# Patient Record
Sex: Female | Born: 1970 | Race: White | Hispanic: No | Marital: Married | Smoking: Never smoker
Health system: Southern US, Community
[De-identification: ages and names within clinical notes are randomized; demographics above are authoritative.]

## PROBLEM LIST (undated history)

## (undated) DIAGNOSIS — N83209 Unspecified ovarian cyst, unspecified side: Secondary | ICD-10-CM

## (undated) DIAGNOSIS — R002 Palpitations: Secondary | ICD-10-CM

## (undated) DIAGNOSIS — B379 Candidiasis, unspecified: Secondary | ICD-10-CM

## (undated) DIAGNOSIS — N979 Female infertility, unspecified: Secondary | ICD-10-CM

## (undated) DIAGNOSIS — Z8619 Personal history of other infectious and parasitic diseases: Secondary | ICD-10-CM

## (undated) DIAGNOSIS — G43909 Migraine, unspecified, not intractable, without status migrainosus: Secondary | ICD-10-CM

## (undated) DIAGNOSIS — H8110 Benign paroxysmal vertigo, unspecified ear: Secondary | ICD-10-CM

## (undated) DIAGNOSIS — E2839 Other primary ovarian failure: Secondary | ICD-10-CM

## (undated) DIAGNOSIS — E288 Other ovarian dysfunction: Secondary | ICD-10-CM

## (undated) DIAGNOSIS — R079 Chest pain, unspecified: Secondary | ICD-10-CM

## (undated) DIAGNOSIS — C439 Malignant melanoma of skin, unspecified: Secondary | ICD-10-CM

## (undated) DIAGNOSIS — N6019 Diffuse cystic mastopathy of unspecified breast: Secondary | ICD-10-CM

## (undated) DIAGNOSIS — N6009 Solitary cyst of unspecified breast: Secondary | ICD-10-CM

## (undated) HISTORY — DX: Unspecified ovarian cyst, unspecified side: N83.209

## (undated) HISTORY — DX: Chest pain, unspecified: R07.9

## (undated) HISTORY — DX: Solitary cyst of unspecified breast: N60.09

## (undated) HISTORY — PX: HERNIA REPAIR: SHX51

## (undated) HISTORY — DX: Palpitations: R00.2

## (undated) HISTORY — PX: MELANOMA EXCISION: SHX5266

## (undated) HISTORY — DX: Diffuse cystic mastopathy of unspecified breast: N60.19

## (undated) HISTORY — PX: UMBILICAL HERNIA REPAIR: SHX196

## (undated) HISTORY — DX: Personal history of other infectious and parasitic diseases: Z86.19

## (undated) HISTORY — DX: Female infertility, unspecified: N97.9

## (undated) HISTORY — DX: Candidiasis, unspecified: B37.9

## (undated) HISTORY — DX: Benign paroxysmal vertigo, unspecified ear: H81.10

---

## 1992-07-02 HISTORY — PX: RHINOPLASTY: SUR1284

## 1998-09-16 ENCOUNTER — Other Ambulatory Visit: Admission: RE | Admit: 1998-09-16 | Discharge: 1998-09-16 | Payer: Self-pay | Admitting: Obstetrics and Gynecology

## 1999-09-18 ENCOUNTER — Other Ambulatory Visit: Admission: RE | Admit: 1999-09-18 | Discharge: 1999-09-18 | Payer: Self-pay | Admitting: Obstetrics and Gynecology

## 2000-09-12 ENCOUNTER — Inpatient Hospital Stay (HOSPITAL_COMMUNITY): Admission: AD | Admit: 2000-09-12 | Discharge: 2000-09-12 | Payer: Self-pay | Admitting: Obstetrics and Gynecology

## 2000-09-19 ENCOUNTER — Other Ambulatory Visit: Admission: RE | Admit: 2000-09-19 | Discharge: 2000-09-19 | Payer: Self-pay | Admitting: Obstetrics and Gynecology

## 2001-04-11 ENCOUNTER — Inpatient Hospital Stay (HOSPITAL_COMMUNITY): Admission: AD | Admit: 2001-04-11 | Discharge: 2001-04-14 | Payer: Self-pay | Admitting: Obstetrics and Gynecology

## 2001-04-11 ENCOUNTER — Encounter (INDEPENDENT_AMBULATORY_CARE_PROVIDER_SITE_OTHER): Payer: Self-pay | Admitting: *Deleted

## 2001-05-12 ENCOUNTER — Other Ambulatory Visit: Admission: RE | Admit: 2001-05-12 | Discharge: 2001-05-12 | Payer: Self-pay | Admitting: Obstetrics and Gynecology

## 2002-08-05 ENCOUNTER — Other Ambulatory Visit: Admission: RE | Admit: 2002-08-05 | Discharge: 2002-08-05 | Payer: Self-pay | Admitting: Obstetrics and Gynecology

## 2003-08-18 ENCOUNTER — Other Ambulatory Visit: Admission: RE | Admit: 2003-08-18 | Discharge: 2003-08-18 | Payer: Self-pay | Admitting: Obstetrics and Gynecology

## 2005-08-13 ENCOUNTER — Encounter: Admission: RE | Admit: 2005-08-13 | Discharge: 2005-08-13 | Payer: Self-pay | Admitting: Obstetrics and Gynecology

## 2006-02-11 ENCOUNTER — Encounter: Admission: RE | Admit: 2006-02-11 | Discharge: 2006-02-11 | Payer: Self-pay | Admitting: Obstetrics and Gynecology

## 2006-09-13 ENCOUNTER — Encounter: Admission: RE | Admit: 2006-09-13 | Discharge: 2006-09-13 | Payer: Self-pay | Admitting: Obstetrics and Gynecology

## 2008-07-02 HISTORY — PX: DILATION AND CURETTAGE OF UTERUS: SHX78

## 2008-09-30 ENCOUNTER — Ambulatory Visit (HOSPITAL_COMMUNITY): Admission: RE | Admit: 2008-09-30 | Discharge: 2008-09-30 | Payer: Self-pay | Admitting: Obstetrics and Gynecology

## 2008-09-30 ENCOUNTER — Encounter (INDEPENDENT_AMBULATORY_CARE_PROVIDER_SITE_OTHER): Payer: Self-pay | Admitting: Obstetrics and Gynecology

## 2009-12-29 ENCOUNTER — Encounter: Admission: RE | Admit: 2009-12-29 | Discharge: 2009-12-29 | Payer: Self-pay | Admitting: Obstetrics and Gynecology

## 2010-07-23 ENCOUNTER — Encounter: Payer: Self-pay | Admitting: Otolaryngology

## 2010-10-11 LAB — URINALYSIS, ROUTINE W REFLEX MICROSCOPIC
Hgb urine dipstick: NEGATIVE
Ketones, ur: NEGATIVE mg/dL
Nitrite: NEGATIVE
Protein, ur: NEGATIVE mg/dL
Urobilinogen, UA: 0.2 mg/dL (ref 0.0–1.0)

## 2010-10-11 LAB — CBC
HCT: 41.6 % (ref 36.0–46.0)
Platelets: 255 10*3/uL (ref 150–400)
WBC: 8.3 10*3/uL (ref 4.0–10.5)

## 2010-11-14 NOTE — H&P (Signed)
NAME:  Madeline Walker, Madeline Walker NO.:  [id number]   MEDICAL RECORD NO.:  [id number]            PATIENT TYPE:   LOCATION:                                 FACILITY:   PHYSICIAN:  Osborn Coho, M.D.   DATE OF BIRTH:  [birth year]   DATE OF ADMISSION:  DATE OF DISCHARGE:                              HISTORY & PHYSICAL    Date of admission is pending at present.  She is going to be scheduled  for a D and C.  Ms. __________ is a 40 year old married white female,  gravida 2, para 1-0-0-1, who entered care at [facility] for  prenatal care on September 20, 2008, for a new OB interview.  She returned  on September 28, 2008, for her new OB workup and on that day was diagnosed  with a missed abortion.  She during a 20-minute conversation plus with  the patient after reviewing her history was very anxious and tearful  regarding her pregnancy and her history of infertility and had much  anxiety after being previously told that she would not be able to  conceive.  She had, had lab work that showed low ovarian reserve and was  told that there would be a low chance to harvest any eggs for an in  vitro fertilization attempt.  However, she did conceive spontaneously  and she had two different consultations with Dr. __________ at [facility] as well as most recently been  seen by Dr. Elesa Hacker, an infertility specialist in [facility].  Following  her conception, she had seen Dr. Elesa Hacker last on September 07, 2008, and had  a transvaginal ultrasound showing a viable single intrauterine pregnancy  with a heart rate and had a crown-rump length that gave an [facility] of  May 01, 2009.  Her LMP was July 19, 2008, which originally gave  her an [facility] of April 25, 2009.  However, since having stopped birth  control pills, she has had irregular periods that have varied in length  and EDC was set by the ultrasound for May 01, 2009.  After having  the discussion in  the office during her new OB conversation, fetal heart  tones could not be found with Doppler and was sent for a transvaginal  ultrasound for viability.  Ultrasound revealed no fetal heart tones,  embryo was still present and it was measuring with a crown-rump length  of 6 weeks and 2 days, which she should have been 9 weeks and 3 days.  Gestational sac was present.  There was no yolk sac.  Findings were  reviewed with the patient.  She was by herself at her interview, but she  did call her husband and after that conversation did desire to go home.  Her history has been remarkable for;  1. Previous C-section.  2. Infertility.  3. History of polyhydramnios with her prior pregnancy.  4. Her son has autism.  5. History of melanoma excision.   During her new OB conversation, she denied any vaginal bleeding or lower  abdominal pain.  She has had some issues  with constipation and has  declined taking any medicine that she can do without.  The medications  that she has been on have been progesterone suppositories every day, as  well as p.o. Prometrium at night.  She has been taking a prenatal  vitamin.   OBSTETRICAL HISTORY:  Rhett Bannister 1 was a C-section, I guess for failure to  descend in October 2002, a female weighing 8 pounds 7 ounces at [redacted] weeks  gestation.  His name is Gerre Pebbles.  He was OP presentation.  He was  delivered by Dr. Cherly Hensen at I guess [facility].  The patient did  voice that she was unhappy with her delivery before, did want to have  [facility] care this time.  She felt like that after two epidurals that she was  not able to push as well as she thought she would be without having to  have a C-section.  Gravida 2 was her current pregnancy.   ALLERGIES:  She denied medication or latex allergies or other  sensitivities.   MENSTRUAL HISTORY:  She reported menarche at age 66.  She had used birth  control pills for some time, but since stopping birth control pills to  conceive has  had very irregular cycles.  She did have a certain LMP of  July 19, 2008.  She reported that she felt like she ovulated the  first week in February, however.   PAST MEDICAL HISTORY:  History of hyperemesis and had IV fluids.  I  believe that was with the first pregnancy.  She did have polyhydramnios  with the first pregnancy.  Contraception; she has used birth control  pills and condoms in the past.  She did have a history of an abnormal  Pap that she reported was years ago, but her last Pap was December 22, 2007, and was normal and that was with our physician assistant, Marquis Lunch. Powell.  Dr. Elesa Hacker did let the patient try some Femara induction,  but did not have any results, but did like I said conceive  spontaneously.  She gets infrequent yeast infections, normal childhood  illnesses.  She reports varicella as a child.  Very infrequent UTIs.  She does report having melanoma in the past.  No radiation or chemo,  just excision of moles.   FAMILY HISTORY:  Remarkable for maternal grandfather with heart disease.  Father hypertension and on medication.  Sister varicose veins, as well  as her mother.  Anemia in a sister.  Mother COPD.  Dad TIAs.  Maternal  grandmother rheumatoid arthritis.  Dad prostate and kidney cancer.  Maternal uncle lung cancer, deceased.  Paternal grandfather colon  cancer.  Mother committed suicide at age 17, had severe depression.  Mother was alcoholic and nicotine addiction.   GENETIC HISTORY:  Remarkable for patient 28 years of age and has a son  with autism.   SOCIAL HISTORY:  She is a married white female.  Her husband's name is  Darren __________.  He works full-time as an Pensions consultant, he has his Ph.D.  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  Her primary care doctor is Dr. Sigmund Hazel with [facility].  She denied  alcohol, tobacco or illicit drug use.   LABS OF SIGNIFICANCE:  Her blood type is A+.  CBC was drawn on September 28, 2008,  white count was 10.1, hematocrit was 38.3, hemoglobin 13.6 and  platelets were 247.  No other blood work was drawn.  Just to note, lab  work that she had, had drawn in the past she had at Dr. Meridee Score, had a  3 FSH levels and a low AMH level which I believe was 0.10.   OBJECTIVE:  VITAL SIGNS:  Blood pressure on September 28, 2008, was 110/60,  otherwise she was afebrile and other vital signs were stable.  Her  weight was 131.  Her height is 5 feet 9 inches.  Her BMI is 18.8, which  was normal weight.  GENERAL:  She was alert and oriented x3, but very anxious regarding  history and then of course she was appropriately upset after results of  the missed AB on the ultrasound.  HEENT:  Within normal limits and grossly intact.  SKIN:  Warm, dry and intact.  CARDIOVASCULAR:  Regular rate and rhythm without murmur.  LUNGS:  Clear to auscultation bilaterally.  BREASTS:  Deferred.  ABDOMEN:  Soft and nontender.  She does have scar from her C-section.  PELVIC:  Deferred.  EXTREMITIES:  Within normal limits.   IMPRESSION:  1. Missed abortion.  2. On ultrasound, the baby was measuring 6 weeks and 2 days with no      cardiac activity and per previous dating was to be around 9 weeks      and 3 days.  3. History of infertility.  4. Previous cesarean section.  5. History of melanoma.  6. Son with autism.  7. Remote history of abnormal Pap smear.   PLAN:  After reviewing results of the ultrasound with the patient  regarding the missed AB, the patient quickly voiced the desire that she  was ready to go.  I did briefly discussed with her three options;  1. Expectant management.  2. Induction of miscarriage was Cytotec.  3. D and C.   The risks, benefits and alternatives were just briefly reviewed with the  patient and did desire to proceed with D and C.  The patient was unsure  upon scheduling, but would like to do as soon as possible.  She then  left after having a CBC drawn and was going to  further talk to her  husband about schedule.  I did briefly do go over the surgery procedure  and plan was made to have __________OR schedule call the patient and set  up the appointment and then have one whichever doctor it is scheduled  with to follow up with her to further answer any questions.      [place]ice [place], [place]      Osborn Coho, M.D.  Electronically Signed    CHS/MEDQ  D:  09/29/2008  T:  09/29/2008  Job:  [id number]

## 2010-11-14 NOTE — Op Note (Signed)
NAMEDANYAL, ADORNO NO.:  [id number]   MEDICAL RECORD NO.:  [id number]          PATIENT TYPE:  AMB   LOCATION:  SDC                           FACILITY:  [facility]   PHYSICIAN:  Janine Limbo, M.D.DATE OF BIRTH:  1971/06/30   DATE OF PROCEDURE:  09/30/2008  DATE OF DISCHARGE:                               OPERATIVE REPORT   PREOPERATIVE DIAGNOSIS:  Missed abortion in the first trimester.   POSTOPERATIVE DIAGNOSIS:  Missed abortion in the first trimester.   PROCEDURE:  Dilatation and evacuation.   SURGEON:  Leonard Schwartz, MD   FIRST ASSISTANT:  None.   ANESTHETIC:  General.   DISPOSITION:  Ms. Poon is a 40 year old female who presents with a  first trimester missed abortion that was confirmed by ultrasound.  She  understands the indications for her surgical procedure and she accepts  the risks of, but not limited to, anesthetic complications, bleeding,  infections, and possible damage to surrounding organs.   FINDINGS:  The uterus sounded to 13 cm.  A moderate-to-large amount of  products of conception removed within the uterine cavity.  No adnexal  masses were appreciated on exam under anesthesia.  The patient's blood  type is noted to be A.   PROCEDURE:  The patient was taken to the operating room where a general  anesthetic was given.  The patient's lower abdomen, perineum, and vagina  were prepped with multiple layers of Betadine.  The bladder was drained  of urine.  Examination under anesthesia was performed.  The patient was  sterilely draped.  A paracervical block was placed using 10 mL of 0.5%  Marcaine with epinephrine.  The uterus sounded to 13 cm.  The cervix was  gently dilated.  The uterine cavity was evacuated using a size 10  suction curette followed by a medium sharp curette.  The cavity was felt  to be clean at the end of our procedure.  Hemostasis was adequate.  All  incisions were removed.  Exam under anesthesia was  repeated and the  uterus was noted to be firm.  Sponge, needle, and instrument counts were  correct on 2 occasions.  The estimated blood loss was 20 mL.  The  patient tolerated her procedure well.  She was awakened from her  anesthetic without difficulty and transported to the recovery room in  stable condition.  The products of conception were sent to Pathology for  evaluation.   FOLLOW-UP INSTRUCTIONS:  The patient will take Zofran 8 mg every 8 hours  as needed for nausea.  She will take Motrin 800 mg every 8 hours as  needed for mild-to-moderate pain.  She will take Vicodin 1 or 2 tablets  every 4  hours as needed for severe pain.  She will return to see Dr. Stefano Gaul in  2-3 weeks for followup examination.  She was given a copy of the  postoperative instruction sheet that was prepared by the [facility] for patients who have undergone a dilatation and  curettage.  She was told to call for questions or concerns.  Janine Limbo, M.D.  Electronically Signed     AVS/MEDQ  D:  09/30/2008  T:  09/30/2008  Job:  [id number]   cc:   Dr. Christin Bach

## 2010-11-17 NOTE — Discharge Summary (Signed)
[facility]  Patient:    Madeline Walker, Madeline Walker Visit Number: [id number] MRN: [id number]          Service Type: OBS Location: 910A [id number] [id number] Attending Physician:  Maxie Better Dictated by:   Sheria Lang. Cherly Hensen, M.D. Admit Date:  04/11/2001 Discharge Date: 04/14/2001                             Discharge Summary  ADMISSION DIAGNOSES:          1. Term gestation.                               2. Active labor.                               3. Spontaneous rupture of membranes.  DISCHARGE DIAGNOSES:          1. Term gestation, delivered.                               2. Status post primary cesarean section.                               3. Arrest of descent.  PROCEDURE:                    1. Primary cesarean section.                               2. Removal of skin lesion.  HISTORY OF PRESENT ILLNESS:   This is a 40 year old, gravida 1, para 0, female at term admitted in active labor with spontaneous rupture of membranes at 3:11 a.m. Her prenatal course had been uncomplicated. Her group B strep culture was negative.  HOSPITAL COURSE:              The patient was admitted to [facility]. At the time that she presented, her cervix was 5 to 6 cm dilated, 90% effaced, and +1 station. Her contractions were about every five to six minutes and she had a reactive nonstress test. She had had spontaneous rupture of membranes at 3:11 a.m. The patient requested and obtained an epidural. She subsequently had Pitocin augmentation of her labor. Intrauterine pressure catheter was also placed and became fully dilated around 1 p.m. Her contractions were about every two to four minutes with no urge to push. During the course of her labor, however, she had a very dense epidural block. With the absence of the urge to push, the epidural was discontinued; however, with the discontinuation of her epidural, she had a window of pain in the right hip area and  her intrauterine pressure catheter revealed suboptimal [place] units, even with the Pitocin being at the maximum 20 milliunits. The arrest of descent was thought to be secondary to the suboptimal uterine pressure in the setting of a previously nonfunctioning epidural. The vertex was allowed to labor down and await urge to push. But even with the urge subsequently to push being felt by the patient, her pain was not tolerable. An examination reveals she was fully, +1 station, left occiput posterior presentation with a caput. The patient was placed in  an exaggerated Sims position and a fetal scalp electrode was placed to further evaluate the baby in that position. The patient was unable to tolerate the degree of pain associated with a nonfunctioning epidural, and even though it was dosed and the anesthesiologist attempted at different modalities to facilitate pain management, epidural was subsequently replaced and we still had issues with her pain control. With the head of the baby still not low enough for an operative assisted vaginal delivery, the patient requested to proceed with a primary cesarean section. Risk and benefit of the procedure was explained to the patient and her husband. She was transferred to the operating room where primary cesarean section was performed. She has a skin lesion just to the left and superior aspect of her Pfannenstiel skin incision, and the patient also requested that be removed as well. With the cesarean section, there was a resultant delivery of a live female from the left occiput posterior presentation weighing 8 pounds 7 ounces and Apgars of 9 and 9. Normal tubes and ovaries were noted. The skin lesion was removed. 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 The patient, whose blood type was A positive and rubella immune and hepatitis B surface antigen negative, had an uncomplicated postoperative course.  She was tolerating a regular diet, passing flatus by postoperative day #3. Her CBC on postoperative day #1 showed a hemoglobin of 10.4, hematocrit of 29.9, white count of 19.5, platelets of 177,000. The incision showed good approximation with no erythema, induration, or exudate. She was deemed well to be discharged on postoperative day #3.  DISPOSITION:                  Home.  CONDITION:                    Stable.  DISCHARGE MEDICATIONS:        1. Percocet one to two tablets every three to                                  four hours p.r.n. pain.                               2. Prenatal vitamins one p.o. q.d.                               3. Motrin 800 mg one p.o. q.6h. p.r.n. pain.  DISCHARGE FOLLOWUP:           The patient is to have a followup appointment in four weeks at [facility].  DISCHARGE INSTRUCTIONS:       The patient is to call for temperature greater than or equal of 100.4, nothing per vagina for four to six weeks. No heavy lifting or driving for two weeks, call if soaking a regular pad every hour or more frequently, incisional redness or drainage, or increased incisional pain, severe abdominal pain, nausea, vomiting, passage of large and frequent clots. ictated by:   Sheronette A. Cherly Hensen, M.D. Attending Physician:  Maxie Better DD:  04/26/01 TD:  04/28/01 Job: [id number]

## 2010-11-17 NOTE — H&P (Signed)
[facility]  Patient:    Madeline Walker, Madeline Walker Visit Number: [id number] MRN: [id number]          Service Type: Attending:  Sheronette A. Cherly Hensen, M.D. Dictated by:   Sheria Lang. Cherly Hensen, M.D. Adm. Date:  04/11/01                           History and Physical  CHIEF COMPLAINT:              Induction of labor.  HISTORY OF PRESENT ILLNESS:   This is a 40 year old gravida 1 para 0 female, EDC of April 14, 2001 by ultrasound on October 22, 2000 at which time the patient was 15.1 weeks who is now at term admitted for induction of labor secondary to favorable cervix and the patients request.  The patient has been having increased pelvic pressure and irregular contractions.  Examination in the office on April 10, 2001 revealed a cervix of 2 cm, 70%, -1, vertex, posterior.  Group B strep culture is negative on March 12, 2001.  Prenatal care has been notable for borderline polyhydramnios on ultrasound done on March 07, 2001.  The estimated fetal weight at that time was 5 pounds 11 ounces consistent with the 60th percentile.  Prenatal care is at [facility], primary obstetrician Sheronette A. Cherly Hensen, M.D.  PRENATAL LABORATORY DATA:     Blood type A positive, antibody screen negative. RPR nonreactive.  Rubella immune.  Hepatitis B surface antigen negative.  HIV test nonreactive.  Parvovirus titer is immune.  GC and chlamydia cultures are negative.  Pap was within normal limits.  AFP3 test was normal.  Normal anatomic fetal survey on December 03, 2000 at which time the patient was 21.[redacted] weeks gestation.  One-hour GCT was normal.  Group B strep culture was negative. Ultrasound on March 07, 2001 for size greater than dates showed an AGA fetus and borderline polyhydramnios.  PAST MEDICAL HISTORY:  ALLERGIES:                    AMOXICILLIN and PENICILLIN - both cause nausea and vomiting.  MEDICATIONS:                  Prenatal vitamins and  iron.  MEDICAL HISTORY:              Negative.  SURGERY:                      Nose surgery.  FAMILY HISTORY:               Father - prostate cancer; paternal grandfather - colon cancer.  Maternal grandfather died of heart disease.  Diabetes in maternal grandfather as well.  Mother is emphysema/smoker.  SOCIAL HISTORY:               Married, nonsmoker.  Husband is an Pensions consultant. She is a Midwife.  REVIEW OF SYSTEMS:            Negative.  PHYSICAL EXAMINATION:  GENERAL:                      Gravid well-developed, well-nourished white female, no acute distress.  VITAL SIGNS:                  Blood pressure 120/76, fetal heart rate 152. Weight 166.8.  SKIN:  Shows no lesions.  HEENT:                        Anicteric sclerae, pink conjunctivae. Oropharynx negative.  HEART:                        Regular rate and rhythm without murmur.  LUNGS:                        Clear to auscultation.  BREASTS:                      Soft, nontender.  No palpable mass.  ABDOMEN:                      Gravid.  Fundal height of 40 cm.  PELVIC:                       Revealed 2, 70%, -1, vertex.  IMPRESSION:                   Term gestation with favorable cervix.  PLAN:                         Admission, routine admission labs, amniotomy. Pitocin augmentation if necessary.  Epidural anesthesia if patient desires; analgesics otherwise. Dictated by:   Sheria Lang. Cherly Hensen, M.D. Attending:  Sheronette A. Cherly Hensen, M.D. DD:  04/11/01 TD:  04/11/01 Job: [id number]

## 2010-11-17 NOTE — Op Note (Signed)
[facility]  Patient:    Madeline Walker, Madeline Walker Visit Number: [id number] MRN: [id number]          Service Type: OBS Location: 910A [id number] Attending Physician:  Maxie Better Dictated by:   Sheria Lang. Cherly Hensen, M.D. Proc. Date: 04/11/01 Admit Date:  04/11/2001                             Operative Report  PREOPERATIVE DIAGNOSES:       1. Arrest of descent.                               2. Skin lesion.  PROCEDURE:                    1. Primary cesarean section, Kerr hysterotomy.                               2. Removal of skin lesion.  POSTOPERATIVE DIAGNOSES:      1. Arrest of descent.                               2. Skin lesion.  ANESTHESIA:                   Epidural.  SURGEON:                      Sheronette A. Cherly Hensen, M.D.  ASSISTANT:                    Lenoard Aden, M.D.  ESTIMATED BLOOD LOSS:         500 cc.  INTRAOPERATIVE FLUID:         2500 cc crystalloid.  DESCRIPTION OF PROCEDURE:     Under adequate general anesthesia, the patient was placed in supine position with a left lateral tilt. An indwelling Foley catheter was already in place prior to transfer to the operating room. The patient was sterilely prepped and draped in the usual fashion. A 1-cm raised lesion was noted in the left lower quadrant of her abdomen which the patient desired to have removed. A marking pen was used to delineate a Pfannenstiel skin incision and 8 cc of 0.25% Marcaine was injected along the marked line. A Pfannenstiel skin incision was then made, carried down to the rectus fascia. The rectus fascia was incised in the midline and extended bilaterally using Bovie cautery. The rectus fascia was then bluntly and sharply dissected off the rectus muscles in a superior and inferior fashion. Rectus muscles were separated in the midline and the parietal peritoneum was entered bluntly. The vesicouterine peritoneum was then opened transversely and the  bladder dissected off the lower uterine segment and displaced from the operative field using a Doyen retractor. A curvilinear low transverse uterine incision was then made and extended bluntly. Subsequent delivery of a live female infant from the left occiput posterior presentation was accomplished. The baby was bulb suctioned on the abdomen. The cord was clamped, cut, and the baby was transferred to the awaiting pediatricians who assigned Apgars of 9 and 9 at one and five minutes. The placenta, which was posterior, was manually removed. The uterine cavity was cleaned of debris. The uterus was not exteriorized. The uterine  incision was then demarcated using ring clamps and the incision was closed with O Monocryl in a running locked stitch. A second layer was imbricated using O Monocryl suture. A small bleeder on the left lateral aspect of the incision was isolated and hemostased using 3-0 Vicryl sutures. Good hemostasis was subsequently noted along the incision line. Normal tubes and ovaries were noted bilaterally. The abdomen was irrigated and suctioned of debris. Reinspection of the incision site again showed good hemostasis. The parietal and the vesicouterine peritoneum were not closed. The fascia was then closed with O Vicryl x 2. The subcuticular area was irrigated and at that point, the raised lesion, which was about 3 mm from the superior margin of the skin incision for the C-section, was removed using a scalpel. The site was cauterized for hemostasis and a 6-0 Vicryl mattress suture was placed. The skin was approximated using Ethicon staples. Specimen of placenta not sent. Skin lesions sent to pathology. Estimated blood loss was 500 cc. Intraoperative fluid was 2500 cc crystalloid. Urine output was 400 cc urine. Sponge and instrument counts x 2 were correct. There were no complications. The patient tolerated the procedure well and was transferred to recovery room in stable condition.  Weight of the baby was 8 pounds 7 ounces. Dictated by:   Sheria Lang. Cherly Hensen, M.D. Attending Physician:  Maxie Better DD:  04/11/01 TD:  04/12/01 Job: [id number]

## 2011-02-07 ENCOUNTER — Other Ambulatory Visit: Payer: Self-pay | Admitting: Obstetrics and Gynecology

## 2011-02-07 DIAGNOSIS — Z1231 Encounter for screening mammogram for malignant neoplasm of breast: Secondary | ICD-10-CM

## 2011-03-02 ENCOUNTER — Ambulatory Visit: Payer: Self-pay

## 2011-03-26 LAB — ABO/RH: RH Type: POSITIVE

## 2011-03-26 LAB — CBC: Hemoglobin: 13.8 g/dL (ref 12.0–16.0)

## 2011-03-26 LAB — HIV ANTIBODY (ROUTINE TESTING W REFLEX): HIV: NONREACTIVE

## 2011-03-26 LAB — HEPATITIS B SURFACE ANTIGEN: Hepatitis B Surface Ag: NEGATIVE

## 2011-04-02 LAB — GC/CHLAMYDIA PROBE AMP, GENITAL: Gonorrhea: NEGATIVE

## 2011-04-19 ENCOUNTER — Other Ambulatory Visit: Payer: Self-pay | Admitting: Obstetrics and Gynecology

## 2011-06-12 LAB — CBC: Platelets: 226 10*3/uL (ref 150–399)

## 2011-06-22 ENCOUNTER — Other Ambulatory Visit (HOSPITAL_COMMUNITY): Payer: Self-pay | Admitting: Obstetrics and Gynecology

## 2011-06-22 DIAGNOSIS — O2690 Pregnancy related conditions, unspecified, unspecified trimester: Secondary | ICD-10-CM

## 2011-06-28 ENCOUNTER — Encounter (HOSPITAL_COMMUNITY): Payer: Self-pay

## 2011-06-28 ENCOUNTER — Ambulatory Visit (HOSPITAL_COMMUNITY)
Admission: RE | Admit: 2011-06-28 | Discharge: 2011-06-28 | Disposition: A | Discharge status: Home / Self Care | Payer: BC Managed Care – PPO | Source: Ambulatory Visit | Attending: Obstetrics and Gynecology | Admitting: Obstetrics and Gynecology

## 2011-06-28 ENCOUNTER — Ambulatory Visit (HOSPITAL_COMMUNITY)
Admission: RE | Admit: 2011-06-28 | Discharge: 2011-06-28 | Disposition: A | Discharge status: Home / Self Care | Payer: BC Managed Care – PPO | Source: Ambulatory Visit

## 2011-06-28 DIAGNOSIS — Z1389 Encounter for screening for other disorder: Secondary | ICD-10-CM | POA: Insufficient documentation

## 2011-06-28 DIAGNOSIS — O09529 Supervision of elderly multigravida, unspecified trimester: Secondary | ICD-10-CM | POA: Insufficient documentation

## 2011-06-28 DIAGNOSIS — O358XX0 Maternal care for other (suspected) fetal abnormality and damage, not applicable or unspecified: Secondary | ICD-10-CM | POA: Insufficient documentation

## 2011-06-28 DIAGNOSIS — O2690 Pregnancy related conditions, unspecified, unspecified trimester: Secondary | ICD-10-CM

## 2011-06-28 DIAGNOSIS — Z363 Encounter for antenatal screening for malformations: Secondary | ICD-10-CM | POA: Insufficient documentation

## 2011-06-28 DIAGNOSIS — O34219 Maternal care for unspecified type scar from previous cesarean delivery: Secondary | ICD-10-CM | POA: Insufficient documentation

## 2011-06-28 HISTORY — DX: Other ovarian dysfunction: E28.8

## 2011-06-28 HISTORY — DX: Other primary ovarian failure: E28.39

## 2011-06-28 NOTE — Progress Notes (Signed)
Genetic Counseling  High-Risk Gestation Note  Appointment Date:  06/28/2011 Referred By: Esmeralda Arthur, MD Date of Birth:  Feb 20, 1971 Partner:  Mickey Farber  Pregnancy History: [id number] Estimated Date of Delivery: 11/05/11 Estimated Gestational Age: [redacted]w[redacted]d Attending: Particia Nearing, MD  Ms. Hiram Comber, and her husband, Mr. Phyillis Dascoli, were seen for genetic counseling regarding a maternal age of 40 y.o. and previous ultrasound findings of echogenic intracardiac focus and pyelectasis.   She was counseled regarding maternal age and the association with risk for chromosome conditions due to nondisjunction with aging of the ova.   We reviewed chromosomes, nondisjunction, and the associated 1 in 36 a priori risk for fetal aneuploidy related to a maternal age of 41 at [redacted]w[redacted]d gestation. She was counseled that the risk for aneuploidy decreases as gestational age increases, accounting for those pregnancies which spontaneously abort.  We specifically discussed Down syndrome (trisomy 21), trisomies 37 and 18 including the common features and prognoses of each.   We reviewed Mrs.Bucklew's normal Harmony (cell free fetal DNA) testing result, which indicated a low risk (less than 1 in 10,000) for fetal Down syndrome, trisomy 18, and trisomy 13. We discussed that this type of noninvasive prenatal testing (NIPT) utilizes cell free fetal DNA found in the maternal circulation. This test is not diagnostic for chromosome conditions, but can provide information regarding the presence or absence of extra fetal DNA for chromosomes 13, 18 and 21. The reported detection rate is greater than 99% for Trisomy 21, greater than 97% for Trisomy 18, and is approximately 80% (8 out of 10) for Trisomy 13. The false positive rate is thought to be less than 1% for any of these conditions.   A complete detailed ultrasound was performed today and confirmed the previous findings of echogenic intracardiac focus (EIF) and  pyelectasis.  The ultrasound report will be sent under separate cover. An isolated echogenic focus is generally believed to be a normal variation without any concerns for the pregnancy.  Isolated echogenic cardiac foci are not associated with congenital heart defects in the baby or compromised cardiac function after birth.  However, an echogenic cardiac focus is associated with a slightly increased chance for Down syndrome in the pregnancy. We discussed that fetal pyelectasis is defined as the dilatation of the fetal renal pelvis/pelvises due to excess urine. This finding is estimated to occur in 2-3% of fetuses.  The female to female ratio is 2:1.  Typically, babies with mild pyelectasis are born normal and healthy and we are usually unable to determine why this extra fluid is present.  This urine accumulation may regress, stay the same or continue to accumulate.  The more fluid that accumulates, the more likely this fluid could be the result of a compromise in kidney function, an obstruction, or narrowing of the ureters which transport urine out of the body, thus causing backflow of fluid into the kidneys.  Therefore, it can be important to follow pyelectasis to make sure it does not become more concerning.  Also, in some cases postnatal evaluation of baby's kidneys may be warranted.  We discussed that the finding of pyelectasis is associated with an increased risk for fetal aneuploidy.  Thus, presence of an EIF and pyelectasis would increase the chance for Down syndrome in the pregnancy above the less than 1 in 10,000 risk determined from cell free DNA testing (Harmony) to approximately less than 1 in 1,000. We discussed that this risk is less than the patient's a priori risk and less  than the risk of complications for amniocentesis.   They were counseled regarding the diagnostic option of amniocentesis. The risks, benefits, and limitations of amniocentesis were reviewed.  After thoughtful consideration of these  options, she elected to proceed with ultrasound only, but declined amniocentesis.  They understand that screening tests cannot rule out all birth defects or genetic syndromes.   Both family histories were reported to be negative for birth defects, mental retardation, and known genetic conditions. A detailed family history discussion was declined at this time. The couple reported a son with autism. An underlying cause is not known for his autism. We discussed that autism is part of the spectrum of conditions referred to as Autistic spectrum disorders (ASD).  ASDs are among the most common neurodevelopmental disorders, with approximately 1 in 110 children meeting criteria for ASD. Approximately 80% of individuals diagnosed are female. There is strong evidence that genetic factors play a critical role in development of ASD. There have been recent advances in identifying specific genetic causes of ASD, however, there are still many individuals for whom the etiology of the ASD is not known. Once a family has a child with a diagnosis of ASD, there is a 13.5% chance to have another child with ASD. If the pregnancy is female the chance is approximately 9%, and approximately 26% if the pregnancy is female. When there is more than one affected sibling, the recurrence chance is 32%. They understand that at this time there is not genetic testing available for ASD for most families.  Without further information regarding the provided family history, an accurate genetic risk cannot be calculated. Further genetic counseling is warranted if more information is obtained.  Mrs. Piper denied exposure to environmental toxins or chemical agents. She denied the use of alcohol, tobacco or street drugs. She denied significant viral illnesses during the course of her pregnancy. Her medical and surgical histories were noncontributory.    I counseled this couple regarding the above risks and available options.  The approximate  face-to-face time with the genetic counselor was 16 minutes.  Quinn Plowman, MS Certified Genetic Counselor 06/28/2011

## 2011-07-02 ENCOUNTER — Other Ambulatory Visit (HOSPITAL_COMMUNITY): Payer: Self-pay

## 2011-07-02 ENCOUNTER — Other Ambulatory Visit: Payer: Self-pay

## 2011-07-05 ENCOUNTER — Other Ambulatory Visit (HOSPITAL_COMMUNITY): Payer: Self-pay

## 2011-07-05 ENCOUNTER — Encounter (HOSPITAL_COMMUNITY): Payer: Self-pay

## 2011-07-10 ENCOUNTER — Encounter (HOSPITAL_COMMUNITY): Payer: Self-pay

## 2011-07-10 ENCOUNTER — Other Ambulatory Visit (HOSPITAL_COMMUNITY): Payer: Self-pay

## 2011-07-11 ENCOUNTER — Other Ambulatory Visit: Payer: Self-pay

## 2011-08-01 ENCOUNTER — Telehealth (HOSPITAL_COMMUNITY): Payer: Self-pay | Admitting: MS"

## 2011-08-01 NOTE — Telephone Encounter (Signed)
Patient called to inquire about obtaining MaterniT21 testing (cell free fetal DNA testing). The patient understands that MaterniT21 and Cathlean Sauer are both cell free fetal DNA testing (noninvasive prenatal testing) performed at different laboratories. We reviewed that neither is considered diagnostic, which the patient understands. She stated that obtaining a negative MaterniT21 result in addition to a negative Harmony result would likely give her enough peace of mind regarding the risk for Down syndrome in the pregnancy, but that a positive result on follow-up cell free fetal DNA testing may prompt her to pursue amniocentesis. She stated that with the current risk for Down syndrome in the pregnancy, she does not feel comfortable pursuing amniocentesis. However, she would like to obtain additional information regarding the pregnancy specific risk. The patient understands that it would not typically be recommended to repeat cell free fetal DNA testing when a result was previously obtained. We reviewed the potential financial cost of pursuing this second testing. With an expressed understanding of the risks, benefits, and limitations of MaterniT21, the patient would like to pursue this testing. I discussed that we are able to facilitate this testing at our office, and I will contact the patient once we have obtained the kit from the laboratory in order to schedule a time for lab draw. Patient was encouraged to call with additional questions in the interim.

## 2011-08-03 ENCOUNTER — Ambulatory Visit (HOSPITAL_COMMUNITY)
Admission: RE | Admit: 2011-08-03 | Discharge: 2011-08-03 | Disposition: A | Discharge status: Home / Self Care | Payer: BC Managed Care – PPO | Source: Ambulatory Visit | Attending: Obstetrics and Gynecology | Admitting: Obstetrics and Gynecology

## 2011-08-03 DIAGNOSIS — Z1231 Encounter for screening mammogram for malignant neoplasm of breast: Secondary | ICD-10-CM | POA: Insufficient documentation

## 2011-08-04 ENCOUNTER — Other Ambulatory Visit: Payer: Self-pay

## 2011-08-13 ENCOUNTER — Telehealth (HOSPITAL_COMMUNITY): Payer: Self-pay | Admitting: MS"

## 2011-08-13 NOTE — Telephone Encounter (Signed)
Called Madeline Walker to discuss her MaterniT21, cell free fetal DNA testing.  We reviewed that these are Negative, showing the expected representation of chromosomes 21, 18, and 13. Y chromosome maternal was detected in the sample, consistent with female fetal gender.   We reviewed that this testing identifies > 99% of pregnancies with trisomy 26 and trisomy 41, and >91% with trisomy 43; the false positive rate is <0.1% for all conditions.  She understands that this testing does not identify all genetic conditions.  Madeline Walker stated that she feels much more comfortable given that two forms of cell free fetal DNA testing (Harmony and MaterniT21) indicated negative/normal results. We reviewed that the two soft markers on ultrasound are most likely benign variants. All questions were answered to her satisfaction, she was encouraged to call with additional questions or concerns.  Quinn Plowman, MS Patent attorney

## 2011-09-03 ENCOUNTER — Encounter (INDEPENDENT_AMBULATORY_CARE_PROVIDER_SITE_OTHER): Payer: BC Managed Care – PPO | Admitting: Obstetrics and Gynecology

## 2011-09-03 DIAGNOSIS — Z331 Pregnant state, incidental: Secondary | ICD-10-CM

## 2011-09-17 ENCOUNTER — Encounter (INDEPENDENT_AMBULATORY_CARE_PROVIDER_SITE_OTHER): Payer: BC Managed Care – PPO | Admitting: Obstetrics and Gynecology

## 2011-09-17 DIAGNOSIS — Z331 Pregnant state, incidental: Secondary | ICD-10-CM

## 2011-09-27 ENCOUNTER — Encounter: Payer: Self-pay | Admitting: Obstetrics and Gynecology

## 2011-09-27 ENCOUNTER — Ambulatory Visit: Payer: Self-pay

## 2011-09-27 ENCOUNTER — Encounter (INDEPENDENT_AMBULATORY_CARE_PROVIDER_SITE_OTHER): Payer: BC Managed Care – PPO | Admitting: Obstetrics and Gynecology

## 2011-09-27 DIAGNOSIS — N1330 Unspecified hydronephrosis: Secondary | ICD-10-CM

## 2011-09-27 DIAGNOSIS — Z331 Pregnant state, incidental: Secondary | ICD-10-CM

## 2011-10-01 ENCOUNTER — Other Ambulatory Visit: Payer: Self-pay | Admitting: Obstetrics and Gynecology

## 2011-10-01 ENCOUNTER — Encounter: Payer: BC Managed Care – PPO | Admitting: Obstetrics and Gynecology

## 2011-10-01 DIAGNOSIS — Z331 Pregnant state, incidental: Secondary | ICD-10-CM

## 2011-10-01 DIAGNOSIS — N1330 Unspecified hydronephrosis: Secondary | ICD-10-CM

## 2011-10-09 ENCOUNTER — Ambulatory Visit (INDEPENDENT_AMBULATORY_CARE_PROVIDER_SITE_OTHER): Payer: BC Managed Care – PPO | Admitting: Obstetrics and Gynecology

## 2011-10-09 ENCOUNTER — Ambulatory Visit (INDEPENDENT_AMBULATORY_CARE_PROVIDER_SITE_OTHER): Payer: BC Managed Care – PPO

## 2011-10-09 ENCOUNTER — Other Ambulatory Visit: Payer: BC Managed Care – PPO

## 2011-10-09 VITALS — BP 90/56 | Wt 151.0 lb

## 2011-10-09 DIAGNOSIS — N898 Other specified noninflammatory disorders of vagina: Secondary | ICD-10-CM

## 2011-10-09 DIAGNOSIS — N1330 Unspecified hydronephrosis: Secondary | ICD-10-CM

## 2011-10-09 DIAGNOSIS — Z331 Pregnant state, incidental: Secondary | ICD-10-CM

## 2011-10-09 DIAGNOSIS — N2889 Other specified disorders of kidney and ureter: Secondary | ICD-10-CM

## 2011-10-09 LAB — US OB FOLLOW UP

## 2011-10-09 NOTE — Patient Instructions (Signed)
Labor Induction  Most women go into labor on their own between 37 and 42 weeks of the pregnancy. When this does not happen or when there is a medical need, medicine or other methods may be used to induce labor. Labor induction causes a pregnant woman's uterus to contract. It also causes the cervix to soften (ripen), open (dilate), and thin out (efface). Usually, labor is not induced before 39 weeks of the pregnancy unless there is a problem with the baby or mother. Whether your labor will be induced depends on a number of factors, including the following:  The medical condition of you and the baby.   How many weeks along you are.   The status of baby's lung maturity.   The condition of the cervix.   The position of the baby.  REASONS FOR LABOR INDUCTION  The health of the baby or mother is at risk.   The pregnancy is overdue by 1 week or more.   The water breaks but labor does not start on its own.   The mother has a health condition or serious illness such as high blood pressure, infection, placental abruption, or diabetes.   The amniotic fluid amounts are low around the baby.   The baby is distressed.  REASONS TO NOT INDUCE LABOR Labor induction may not be a good idea if:  It is shown that your baby does not tolerate labor.   An induction is just more convenient.   You want the baby to be born on a certain date, like a holiday.   You have had previous surgeries on your uterus, such as a myomectomy or the removal of fibroids.   Your placenta lies very low in the uterus and blocks the opening of the cervix (placenta previa).   Your baby is not in a head down position.   The umbilical cord drops down into the birth canal in front of the baby. This could cut off the baby's blood and oxygen supply.   You have had a previous cesarean delivery.   There areunusual circumstances, such as the baby being extremely premature.  RISKS AND COMPLICATIONS Problems may occur in the  process of induction and plans may need to be modified as a situation unfolds. Some of the risks of induction include:  Change in fetal heart rate, such as too high, too low, or erratic.   Risk of fetal distress.   Risk of infection to mother and baby.   Increased chance of having a cesarean delivery.   The rare, but increased chance that the placenta will separate from the uterus (abruption).   Uterine rupture (very rare).  When induction is needed for medical reasons, the benefits of induction may outweigh the risks. BEFORE THE PROCEDURE Your caregiver will check your cervix and the baby's position. This will help your caregiver decide if you are far enough along for an induction to work. PROCEDURE Several methods of labor induction may be used, such as:   Taking prostaglandin medicine to dilate and ripen the cervix. The medicine will also start contractions. It can be taken by mouth or by inserting a suppository into the vagina.   A thin tube (catheter) with a balloon on the end may be inserted into your vagina to dilate the cervix. Once inserted, the balloon expands with water, which causes the cervix to open.   Striping the membranes. Your caregiver inserts a finger between the cervix and membranes, which causes the cervix to be stretched and   may cause the uterus to contract. This is often done during an office visit. You will be sent home to wait for the contractions to begin. You will then come in for an induction.   Breaking the water. Your caregiver will make a hole in the amniotic sac using a small instrument. Once the amniotic sac breaks, contractions should begin. This may still take hours to see an effect.   Taking medicine to trigger or strengthen contractions. This medicine is given intravenously through a tube in your arm.  All of the methods of induction, besides stripping the membranes, will be done in the hospital. Induction is done in the hospital so that you and the  baby can be carefully monitored. AFTER THE PROCEDURE Some inductions can take up to 2 or 3 days. Depending on the cervix, it usually takes less time. It takes longer when you are induced early in the pregnancy or if this is your first pregnancy. If a mother is still pregnant and the induction has been going on for 2 to 3 days, either the mother will be sent home or a cesarean delivery will be needed. Document Released: 11/07/2006 Document Revised: 06/07/2011 Document Reviewed: 04/23/2011 Exit[employer] Patient Information 2012 [employer]. 

## 2011-10-09 NOTE — Progress Notes (Signed)
36 week IUP Normal discharge With slight blood tinged mucus wet mount neg clue neg trich neg hyphae reveiwed s/s labor to report and kick counts Lavera Guise, CNM

## 2011-10-09 NOTE — Progress Notes (Signed)
Having pelvic pressure Braxton Hicks Vaginal discharge brown in color Urine protein- neg

## 2011-10-16 ENCOUNTER — Encounter: Payer: BC Managed Care – PPO | Admitting: Obstetrics and Gynecology

## 2011-10-19 ENCOUNTER — Telehealth: Payer: Self-pay | Admitting: Obstetrics and Gynecology

## 2011-10-19 ENCOUNTER — Ambulatory Visit (INDEPENDENT_AMBULATORY_CARE_PROVIDER_SITE_OTHER): Payer: BC Managed Care – PPO | Admitting: Obstetrics and Gynecology

## 2011-10-19 VITALS — BP 100/58 | Ht 68.5 in | Wt 151.0 lb

## 2011-10-19 DIAGNOSIS — O99820 Streptococcus B carrier state complicating pregnancy: Secondary | ICD-10-CM | POA: Insufficient documentation

## 2011-10-19 DIAGNOSIS — Z22330 Carrier of Group B streptococcus: Secondary | ICD-10-CM

## 2011-10-19 DIAGNOSIS — O09299 Supervision of pregnancy with other poor reproductive or obstetric history, unspecified trimester: Secondary | ICD-10-CM

## 2011-10-19 DIAGNOSIS — O358XX0 Maternal care for other (suspected) fetal abnormality and damage, not applicable or unspecified: Secondary | ICD-10-CM

## 2011-10-19 DIAGNOSIS — IMO0002 Reserved for concepts with insufficient information to code with codable children: Secondary | ICD-10-CM | POA: Insufficient documentation

## 2011-10-19 DIAGNOSIS — O09899 Supervision of other high risk pregnancies, unspecified trimester: Secondary | ICD-10-CM

## 2011-10-19 DIAGNOSIS — Z331 Pregnant state, incidental: Secondary | ICD-10-CM

## 2011-10-19 DIAGNOSIS — Z9889 Other specified postprocedural states: Secondary | ICD-10-CM

## 2011-10-19 DIAGNOSIS — Z98891 History of uterine scar from previous surgery: Secondary | ICD-10-CM | POA: Insufficient documentation

## 2011-10-19 NOTE — Telephone Encounter (Signed)
Routed to [place]

## 2011-10-19 NOTE — Progress Notes (Signed)
See 4/19 note--power surge during visit.

## 2011-10-19 NOTE — Progress Notes (Signed)
Doing well.  Reviewed birth plan. Reviewed GBS + result--patient "will do research and decide about treatment".. Advised Patient the recommendation is for treatment. Will discuss at [place]. Vtx to [place].  Declines VE.

## 2011-10-22 NOTE — Telephone Encounter (Signed)
Spoke with pt rgd mag pt positve for group b strep wants to be tested again after taking herbal supp advised pt can discuss with provider at next visit. Pt voice understanding

## 2011-10-23 ENCOUNTER — Encounter: Payer: BC Managed Care – PPO | Admitting: Obstetrics and Gynecology

## 2011-10-25 ENCOUNTER — Ambulatory Visit (INDEPENDENT_AMBULATORY_CARE_PROVIDER_SITE_OTHER): Payer: BC Managed Care – PPO | Admitting: Obstetrics and Gynecology

## 2011-10-25 VITALS — BP 106/62 | Wt 150.0 lb

## 2011-10-25 DIAGNOSIS — Z9889 Other specified postprocedural states: Secondary | ICD-10-CM

## 2011-10-25 DIAGNOSIS — O99820 Streptococcus B carrier state complicating pregnancy: Secondary | ICD-10-CM

## 2011-10-25 DIAGNOSIS — Z3480 Encounter for supervision of other normal pregnancy, unspecified trimester: Secondary | ICD-10-CM

## 2011-10-25 DIAGNOSIS — IMO0002 Reserved for concepts with insufficient information to code with codable children: Secondary | ICD-10-CM

## 2011-10-25 DIAGNOSIS — O09899 Supervision of other high risk pregnancies, unspecified trimester: Secondary | ICD-10-CM

## 2011-10-25 DIAGNOSIS — O358XX0 Maternal care for other (suspected) fetal abnormality and damage, not applicable or unspecified: Secondary | ICD-10-CM

## 2011-10-25 DIAGNOSIS — Z98891 History of uterine scar from previous surgery: Secondary | ICD-10-CM

## 2011-10-25 DIAGNOSIS — Z22330 Carrier of Group B streptococcus: Secondary | ICD-10-CM

## 2011-10-25 NOTE — Progress Notes (Signed)
1) Expressed concerns re GBS result from last visit.  Requests retesting because she has been doing Naturopathic measures of 2teaspoons of Hydrogen peroxide vaginally and Tea tree oil rectally daily.She wants to avoid antibiotics in labor if at all possible. Reviewed rationale for antibiotic Rx even if this test comes back negative. Asked whether the baby could be cultured immediately after birth.  Referred her to Dr. Roxy Cedar for this decision.

## 2011-10-29 ENCOUNTER — Telehealth: Payer: Self-pay | Admitting: Obstetrics and Gynecology

## 2011-10-29 LAB — CULTURE, BETA STREP (GROUP B ONLY)

## 2011-10-30 ENCOUNTER — Encounter: Payer: Self-pay | Admitting: Obstetrics and Gynecology

## 2011-10-30 ENCOUNTER — Inpatient Hospital Stay (HOSPITAL_COMMUNITY)
Admission: AD | Admit: 2011-10-30 | Discharge: 2011-11-02 | DRG: 370 | Disposition: A | Discharge status: Home / Self Care | Payer: BC Managed Care – PPO | Source: Ambulatory Visit | Attending: Obstetrics and Gynecology | Admitting: Obstetrics and Gynecology

## 2011-10-30 ENCOUNTER — Ambulatory Visit (INDEPENDENT_AMBULATORY_CARE_PROVIDER_SITE_OTHER): Payer: BC Managed Care – PPO | Admitting: Obstetrics and Gynecology

## 2011-10-30 ENCOUNTER — Encounter (HOSPITAL_COMMUNITY): Payer: Self-pay | Admitting: Obstetrics

## 2011-10-30 VITALS — BP 100/58 | Wt 153.0 lb

## 2011-10-30 DIAGNOSIS — O09529 Supervision of elderly multigravida, unspecified trimester: Secondary | ICD-10-CM | POA: Diagnosis present

## 2011-10-30 DIAGNOSIS — O99892 Other specified diseases and conditions complicating childbirth: Secondary | ICD-10-CM | POA: Diagnosis present

## 2011-10-30 DIAGNOSIS — Z98891 History of uterine scar from previous surgery: Secondary | ICD-10-CM | POA: Diagnosis present

## 2011-10-30 DIAGNOSIS — IMO0002 Reserved for concepts with insufficient information to code with codable children: Secondary | ICD-10-CM

## 2011-10-30 DIAGNOSIS — O99820 Streptococcus B carrier state complicating pregnancy: Secondary | ICD-10-CM

## 2011-10-30 DIAGNOSIS — O324XX0 Maternal care for high head at term, not applicable or unspecified: Secondary | ICD-10-CM | POA: Diagnosis present

## 2011-10-30 DIAGNOSIS — Z22330 Carrier of Group B streptococcus: Secondary | ICD-10-CM

## 2011-10-30 DIAGNOSIS — O09299 Supervision of pregnancy with other poor reproductive or obstetric history, unspecified trimester: Secondary | ICD-10-CM

## 2011-10-30 DIAGNOSIS — O9903 Anemia complicating the puerperium: Secondary | ICD-10-CM | POA: Diagnosis not present

## 2011-10-30 DIAGNOSIS — Z331 Pregnant state, incidental: Secondary | ICD-10-CM

## 2011-10-30 DIAGNOSIS — O34219 Maternal care for unspecified type scar from previous cesarean delivery: Secondary | ICD-10-CM | POA: Diagnosis present

## 2011-10-30 DIAGNOSIS — D649 Anemia, unspecified: Secondary | ICD-10-CM | POA: Diagnosis not present

## 2011-10-30 DIAGNOSIS — O358XX0 Maternal care for other (suspected) fetal abnormality and damage, not applicable or unspecified: Secondary | ICD-10-CM

## 2011-10-30 LAB — CBC
MCH: 32.3 pg (ref 26.0–34.0)
Platelets: 227 10*3/uL (ref 150–400)
RBC: 4.27 MIL/uL (ref 3.87–5.11)
WBC: 14.8 10*3/uL — ABNORMAL HIGH (ref 4.0–10.5)

## 2011-10-30 MED ORDER — LIDOCAINE HCL (PF) 1 % IJ SOLN
INTRAMUSCULAR | Status: AC
  Filled 2011-10-30: qty 30

## 2011-10-30 MED ORDER — ACETAMINOPHEN 325 MG PO TABS: 650.0000 mg | ORAL_TABLET | ORAL | Status: DC | PRN

## 2011-10-30 MED ORDER — OXYTOCIN 20 UNITS IN LACTATED RINGERS INFUSION - SIMPLE: 125.0000 mL/h | Freq: Once | INTRAVENOUS | Status: DC

## 2011-10-30 MED ORDER — LACTATED RINGERS IV SOLN
INTRAVENOUS | Status: DC
  Administered 2011-10-30: 21:00:00 via INTRAVENOUS

## 2011-10-30 MED ORDER — EPHEDRINE 5 MG/ML INJ: 10.0000 mg | INTRAVENOUS | Status: DC | PRN

## 2011-10-30 MED ORDER — LACTATED RINGERS IV SOLN: 500.0000 mL | Freq: Once | INTRAVENOUS | Status: DC

## 2011-10-30 MED ORDER — OXYCODONE-ACETAMINOPHEN 5-325 MG PO TABS: 1.0000 | ORAL_TABLET | ORAL | Status: DC | PRN

## 2011-10-30 MED ORDER — OXYTOCIN 10 UNIT/ML IJ SOLN
INTRAMUSCULAR | Status: AC
  Filled 2011-10-30: qty 2

## 2011-10-30 MED ORDER — FLEET ENEMA 7-19 GM/118ML RE ENEM: 1.0000 | ENEMA | RECTAL | Status: DC | PRN

## 2011-10-30 MED ORDER — IBUPROFEN 600 MG PO TABS: 600.0000 mg | ORAL_TABLET | Freq: Four times a day (QID) | ORAL | Status: DC | PRN

## 2011-10-30 MED ORDER — FENTANYL 2.5 MCG/ML BUPIVACAINE 1/10 % EPIDURAL INFUSION (WH - ANES)
14.0000 mL/h | INTRAMUSCULAR | Status: DC
  Administered 2011-10-30: 14 mL/h via EPIDURAL
  Filled 2011-10-30: qty 60

## 2011-10-30 MED ORDER — PHENYLEPHRINE 40 MCG/ML (10ML) SYRINGE FOR IV PUSH (FOR BLOOD PRESSURE SUPPORT)
80.0000 ug | PREFILLED_SYRINGE | INTRAVENOUS | Status: DC | PRN
  Filled 2011-10-30: qty 5

## 2011-10-30 MED ORDER — SODIUM CHLORIDE 0.9 % IV SOLN
2.0000 g | Freq: Once | INTRAVENOUS | Status: AC
  Administered 2011-10-30: 2 g via INTRAVENOUS
  Filled 2011-10-30: qty 2000

## 2011-10-30 MED ORDER — ONDANSETRON HCL 4 MG/2ML IJ SOLN: 4.0000 mg | Freq: Four times a day (QID) | INTRAMUSCULAR | Status: DC | PRN

## 2011-10-30 MED ORDER — LIDOCAINE HCL (PF) 1 % IJ SOLN
INTRAMUSCULAR | Status: DC | PRN
  Administered 2011-10-30 (×2): 5 mL

## 2011-10-30 MED ORDER — LACTATED RINGERS IV SOLN: 500.0000 mL | INTRAVENOUS | Status: DC | PRN

## 2011-10-30 MED ORDER — DIPHENHYDRAMINE HCL 50 MG/ML IJ SOLN: 12.5000 mg | INTRAMUSCULAR | Status: DC | PRN

## 2011-10-30 MED ORDER — CITRIC ACID-SODIUM CITRATE 334-500 MG/5ML PO SOLN
30.0000 mL | ORAL | Status: DC | PRN
  Administered 2011-10-31: 30 mL via ORAL
  Filled 2011-10-30: qty 15

## 2011-10-30 MED ORDER — LIDOCAINE HCL (PF) 1 % IJ SOLN: 30.0000 mL | INTRAMUSCULAR | Status: DC | PRN

## 2011-10-30 MED ORDER — OXYTOCIN BOLUS FROM INFUSION
500.0000 mL | Freq: Once | INTRAVENOUS | Status: DC
  Filled 2011-10-30: qty 500

## 2011-10-30 MED ORDER — PHENYLEPHRINE 40 MCG/ML (10ML) SYRINGE FOR IV PUSH (FOR BLOOD PRESSURE SUPPORT): 80.0000 ug | PREFILLED_SYRINGE | INTRAVENOUS | Status: DC | PRN

## 2011-10-30 MED ORDER — EPHEDRINE 5 MG/ML INJ
10.0000 mg | INTRAVENOUS | Status: DC | PRN
  Filled 2011-10-30: qty 4

## 2011-10-30 NOTE — Progress Notes (Signed)
Declines cx check today   

## 2011-10-30 NOTE — MAU Note (Addendum)
Pt brought directly to mau 2 , S. Lillard, CNM called immediately for labor eval.  Pt reports srom at 2010.  Heart tones obtained and cervical check done immediately by RN due to pt discomfort.  Pt transported immediately to birthing suites via stretcher with CNM present.

## 2011-10-30 NOTE — Anesthesia Preprocedure Evaluation (Signed)

## 2011-10-30 NOTE — Progress Notes (Signed)
Patient ID: Madeline Walker, female   DOB: [birth year], 41 y.o.   MRN: [id number] reviewed s/s uc, srom, vag bleeding, daily fetal kick count, pain s to report Lavera Guise, CNM

## 2011-10-30 NOTE — Progress Notes (Signed)
Pushed for three contractions, pt does not feel the need to push no progress with pushing, laboring down for now

## 2011-10-30 NOTE — Telephone Encounter (Signed)
Tc to pt per telephone call. Pt aware GBS positive and will be tx with ATB's during delivery. Pt voices understanding.

## 2011-10-30 NOTE — H&P (Signed)
Madeline Walker is a 41 y.o. female presenting for onset of ctx at about 1600, with SROM at 57, here with doula and husband, VE =C/C/-1  Reports GFM  HPI: pt began PNC at [facility] at 8wks, viability [place] S=D with EDC of 11-05-11. She was started on progest suppos. She received genetic counseling from MFM and had a negative Harmony test, as well as a Normal anat [place], except for LVEIF and bilateral pyelectasis noted. She had increased stressors during pregnancy secondary to son having autism and worried about being AMA with this pregnancy.  1hr gtt was normal and GBS was pos, however pt requested GBS be repeated after trying herbal tx, it was again pos.  Pregnancy significant for: 1. Hx C/S for FTP- desires VBAC 2. Son with autism 3. AMA 4. Premature ovaria failure.    Maternal Medical History:  Reason for admission: Reason for admission: rupture of membranes and contractions.  Contractions: Onset was 3-5 hours ago.   Frequency: regular.   Duration is approximately 60 seconds.   Perceived severity is strong.    Fetal activity: Perceived fetal activity is normal.   Last perceived fetal movement was within the past hour.      OB History    Grav Para Term Preterm Abortions TAB SAB Ect Mult Living   4 1 1  0 2 0 2 0 0 1     Past Medical History  Diagnosis Date  . Premature ovarian failure   . Cancer     Melanoma  . Infection     HX OF OCCASIONAL UTI   Past Surgical History  Procedure Date  . Cesarean section   . Dilation and curettage of uterus 2010    SAB   Family History: family history includes Anemia in her sister; Autism in her son; COPD in her mother; Cancer in her father, maternal grandfather, and maternal uncle; Diabetes in her maternal grandfather; Hypertension in her father; Mental illness in her mother; Migraines in her mother; and Rheum arthritis in her maternal grandmother. Social History:  reports that she has never smoked. She has never used smokeless tobacco. She reports that  she does not drink alcohol or use illicit drugs.  Review of Systems  All other systems reviewed and are negative.    Dilation: 8.5 Effacement (%): 100 Station: -1 Exam by:: Cletis Media, RN Blood pressure 121/79, pulse 98, resp. rate 20, height 5' 8.5" (1.74 m), weight 151 lb (68.493 kg), last menstrual period 01/29/2011. Maternal Exam:  Uterine Assessment: Contraction strength is firm.  Contraction duration is 60 seconds. Contraction frequency is regular.   Abdomen: Patient reports no abdominal tenderness. Surgical scars: low transverse.   Fundal height is aga.   Estimated fetal weight is 7.   Fetal presentation: vertex  Introitus: Normal vulva. Vagina is positive for vaginal discharge.  Amniotic fluid character: clear and bloody.  Pelvis: adequate for delivery.   Cervix: Cervix evaluated by digital exam.   C/c/-1 bloody show, clear fluid, vtx  Fetal Exam Fetal Monitor Review: Mode: ultrasound.   Baseline rate: 125.  Variability: moderate (6-25 bpm).   Pattern: accelerations present and variable decelerations.    Fetal State Assessment: Category I - tracings are normal.     Physical Exam  Nursing note and vitals reviewed. Constitutional: She is oriented to person, place, and time. She appears well-developed and well-nourished.  HENT:  Head: Normocephalic.  Neck: Normal range of motion.  Cardiovascular: Normal rate, regular rhythm and normal heart sounds.   Respiratory:  Effort normal and breath sounds normal.  GI: Soft. Bowel sounds are normal.  Genitourinary: Vaginal discharge found.  Musculoskeletal: Normal range of motion.  Neurological: She is alert and oriented to person, place, and time.  Skin: Skin is warm and dry.  Psychiatric: She has a normal mood and affect. Her behavior is normal.    Prenatal labs: ABO, Rh: A/Positive/-- (09/24 0000) Antibody: Negative (09/24 0000) Rubella: Immune (09/24 0000) RPR: Nonreactive (02/15 0000)  HBsAg: Negative (09/24  0000)  HIV: Non-reactive (09/24 0000)  GBS: POSITIVE (04/09 1557)  GC/CT neg Harmony negative  Assessment/Plan: IUP at [redacted]w[redacted]d Advanced labor 2nd stage  Admit to B.S  Routine CNM orders Ampicillin for GBS Epidural per pt request, after pushing x1hr with minimal effort  Dr Pennie Rushing notified  [name] 10/30/2011, 10:31 PM

## 2011-10-30 NOTE — Anesthesia Procedure Notes (Signed)
Epidural Patient location during procedure: OB Start time: 10/30/2011 9:57 PM  Staffing Anesthesiologist: Brayton Caves R Performed by: anesthesiologist   Preanesthetic Checklist Completed: patient identified, site marked, surgical consent, pre-op evaluation, timeout performed, IV checked, risks and benefits discussed and monitors and equipment checked  Epidural Patient position: sitting Prep: site prepped and draped and DuraPrep Patient monitoring: continuous pulse ox and blood pressure Approach: midline Injection technique: LOR air and LOR saline  Needle:  Needle type: Tuohy  Needle gauge: 17 G Needle length: 9 cm Needle insertion depth: 5 cm cm Catheter type: closed end flexible Catheter size: 19 Gauge Catheter at skin depth: 10 cm Test dose: negative  Assessment Events: blood not aspirated, injection not painful, no injection resistance, negative IV test and no paresthesia  Additional Notes Patient identified.  Risk benefits discussed including failed block, incomplete pain control, headache, nerve damage, paralysis, blood pressure changes, nausea, vomiting, reactions to medication both toxic or allergic, and postpartum back pain.  Patient expressed understanding and wished to proceed.  All questions were answered.  Sterile technique used throughout procedure and epidural site dressed with sterile barrier dressing. No paresthesia or other complications noted.The patient did not experience any signs of intravascular injection such as tinnitus or metallic taste in mouth nor signs of intrathecal spread such as rapid motor block. Please see nursing notes for vital signs.

## 2011-10-31 ENCOUNTER — Encounter (HOSPITAL_COMMUNITY)
Admission: AD | Disposition: A | Discharge status: Home / Self Care | Payer: Self-pay | Source: Ambulatory Visit | Attending: Obstetrics and Gynecology

## 2011-10-31 ENCOUNTER — Encounter (HOSPITAL_COMMUNITY): Payer: Self-pay | Admitting: Anesthesiology

## 2011-10-31 ENCOUNTER — Inpatient Hospital Stay (HOSPITAL_COMMUNITY): Payer: BC Managed Care – PPO | Admitting: Anesthesiology

## 2011-10-31 ENCOUNTER — Encounter (HOSPITAL_COMMUNITY): Payer: Self-pay | Admitting: *Deleted

## 2011-10-31 DIAGNOSIS — Z98891 History of uterine scar from previous surgery: Secondary | ICD-10-CM | POA: Diagnosis present

## 2011-10-31 LAB — RPR: RPR Ser Ql: NONREACTIVE

## 2011-10-31 SURGERY — Surgical Case
Anesthesia: Regional

## 2011-10-31 MED ORDER — CEFAZOLIN SODIUM 1-5 GM-% IV SOLN
INTRAVENOUS | Status: AC
  Filled 2011-10-31: qty 100

## 2011-10-31 MED ORDER — DIPHENHYDRAMINE HCL 50 MG/ML IJ SOLN: 12.5000 mg | Freq: Four times a day (QID) | INTRAMUSCULAR | Status: DC | PRN

## 2011-10-31 MED ORDER — BUPIVACAINE HCL (PF) 0.25 % IJ SOLN
INTRAMUSCULAR | Status: DC | PRN
  Administered 2011-10-31: 20 mL
  Administered 2011-10-31: 10 mL

## 2011-10-31 MED ORDER — ONDANSETRON HCL 4 MG/2ML IJ SOLN
INTRAMUSCULAR | Status: AC
  Filled 2011-10-31: qty 2

## 2011-10-31 MED ORDER — SIMETHICONE 80 MG PO CHEW
80.0000 mg | CHEWABLE_TABLET | Freq: Three times a day (TID) | ORAL | Status: DC
  Administered 2011-10-31 – 2011-11-02 (×8): 80 mg via ORAL

## 2011-10-31 MED ORDER — MEPERIDINE HCL 25 MG/ML IJ SOLN: 6.2500 mg | INTRAMUSCULAR | Status: DC | PRN

## 2011-10-31 MED ORDER — SIMETHICONE 80 MG PO CHEW: 80.0000 mg | CHEWABLE_TABLET | ORAL | Status: DC | PRN

## 2011-10-31 MED ORDER — EPHEDRINE 5 MG/ML INJ
INTRAVENOUS | Status: AC
  Filled 2011-10-31: qty 10

## 2011-10-31 MED ORDER — PROMETHAZINE HCL 25 MG/ML IJ SOLN: 6.2500 mg | INTRAMUSCULAR | Status: DC | PRN

## 2011-10-31 MED ORDER — TETANUS-DIPHTH-ACELL PERTUSSIS 5-2.5-18.5 LF-MCG/0.5 IM SUSP: 0.5000 mL | Freq: Once | INTRAMUSCULAR | Status: DC

## 2011-10-31 MED ORDER — SODIUM BICARBONATE 8.4 % IV SOLN
INTRAVENOUS | Status: DC | PRN
  Administered 2011-10-31: 5 mL via EPIDURAL

## 2011-10-31 MED ORDER — DIPHENHYDRAMINE HCL 25 MG PO CAPS: 25.0000 mg | ORAL_CAPSULE | Freq: Four times a day (QID) | ORAL | Status: DC | PRN

## 2011-10-31 MED ORDER — ONDANSETRON HCL 4 MG/2ML IJ SOLN
INTRAMUSCULAR | Status: DC | PRN
  Administered 2011-10-31: 4 mg via INTRAVENOUS

## 2011-10-31 MED ORDER — WITCH HAZEL-GLYCERIN EX PADS: 1.0000 "application " | MEDICATED_PAD | CUTANEOUS | Status: DC | PRN

## 2011-10-31 MED ORDER — MORPHINE SULFATE 0.5 MG/ML IJ SOLN
INTRAMUSCULAR | Status: AC
  Filled 2011-10-31: qty 10

## 2011-10-31 MED ORDER — LACTATED RINGERS IV SOLN
INTRAVENOUS | Status: DC | PRN
  Administered 2011-10-31: 01:00:00 via INTRAVENOUS

## 2011-10-31 MED ORDER — FENTANYL CITRATE 0.05 MG/ML IJ SOLN: 25.0000 ug | INTRAMUSCULAR | Status: DC | PRN

## 2011-10-31 MED ORDER — OXYTOCIN 20 UNITS IN LACTATED RINGERS INFUSION - SIMPLE
INTRAVENOUS | Status: AC
  Filled 2011-10-31: qty 1000

## 2011-10-31 MED ORDER — LACTATED RINGERS IV SOLN: INTRAVENOUS | Status: DC

## 2011-10-31 MED ORDER — HYDROMORPHONE 0.3 MG/ML IV SOLN
INTRAVENOUS | Status: DC
  Administered 2011-10-31: 0.3 mg via INTRAVENOUS
  Filled 2011-10-31: qty 25

## 2011-10-31 MED ORDER — DIPHENHYDRAMINE HCL 12.5 MG/5ML PO ELIX
12.5000 mg | ORAL_SOLUTION | Freq: Four times a day (QID) | ORAL | Status: DC | PRN
  Filled 2011-10-31: qty 5

## 2011-10-31 MED ORDER — BUPIVACAINE HCL (PF) 0.25 % IJ SOLN
INTRAMUSCULAR | Status: AC
  Filled 2011-10-31: qty 30

## 2011-10-31 MED ORDER — ONDANSETRON HCL 4 MG PO TABS: 4.0000 mg | ORAL_TABLET | ORAL | Status: DC | PRN

## 2011-10-31 MED ORDER — LIDOCAINE-EPINEPHRINE (PF) 2 %-1:200000 IJ SOLN
INTRAMUSCULAR | Status: AC
  Filled 2011-10-31: qty 20

## 2011-10-31 MED ORDER — CEFAZOLIN SODIUM-DEXTROSE 2-3 GM-% IV SOLR
2.0000 g | Freq: Once | INTRAVENOUS | Status: AC
  Administered 2011-10-31: 2 g via INTRAVENOUS
  Filled 2011-10-31: qty 50

## 2011-10-31 MED ORDER — LANOLIN HYDROUS EX OINT: 1.0000 "application " | TOPICAL_OINTMENT | CUTANEOUS | Status: DC | PRN

## 2011-10-31 MED ORDER — OXYTOCIN 10 UNIT/ML IJ SOLN
INTRAMUSCULAR | Status: DC | PRN
  Administered 2011-10-31: 20 [IU] via INTRAMUSCULAR

## 2011-10-31 MED ORDER — SODIUM BICARBONATE 8.4 % IV SOLN
INTRAVENOUS | Status: AC
  Filled 2011-10-31: qty 50

## 2011-10-31 MED ORDER — OXYCODONE-ACETAMINOPHEN 5-325 MG PO TABS
1.0000 | ORAL_TABLET | ORAL | Status: DC | PRN
  Administered 2011-10-31 (×3): 1 via ORAL
  Administered 2011-11-01 – 2011-11-02 (×7): 2 via ORAL
  Filled 2011-10-31 (×3): qty 2
  Filled 2011-10-31: qty 1
  Filled 2011-10-31 (×3): qty 2
  Filled 2011-10-31: qty 1
  Filled 2011-10-31: qty 2
  Filled 2011-10-31: qty 1

## 2011-10-31 MED ORDER — EPHEDRINE SULFATE 50 MG/ML IJ SOLN
INTRAMUSCULAR | Status: DC | PRN
  Administered 2011-10-31 (×4): 10 mg via INTRAVENOUS

## 2011-10-31 MED ORDER — ONDANSETRON HCL 4 MG/2ML IJ SOLN: 4.0000 mg | Freq: Four times a day (QID) | INTRAMUSCULAR | Status: DC | PRN

## 2011-10-31 MED ORDER — MENTHOL 3 MG MT LOZG: 1.0000 | LOZENGE | OROMUCOSAL | Status: DC | PRN

## 2011-10-31 MED ORDER — SODIUM CHLORIDE 0.9 % IJ SOLN: 9.0000 mL | INTRAMUSCULAR | Status: DC | PRN

## 2011-10-31 MED ORDER — MIDAZOLAM HCL 2 MG/2ML IJ SOLN: 0.5000 mg | Freq: Once | INTRAMUSCULAR | Status: DC | PRN

## 2011-10-31 MED ORDER — ZOLPIDEM TARTRATE 5 MG PO TABS: 5.0000 mg | ORAL_TABLET | Freq: Every evening | ORAL | Status: DC | PRN

## 2011-10-31 MED ORDER — SENNOSIDES-DOCUSATE SODIUM 8.6-50 MG PO TABS
2.0000 | ORAL_TABLET | Freq: Every day | ORAL | Status: DC
  Administered 2011-10-31 – 2011-11-01 (×2): 2 via ORAL

## 2011-10-31 MED ORDER — OXYTOCIN 20 UNITS IN LACTATED RINGERS INFUSION - SIMPLE: 125.0000 mL/h | INTRAVENOUS | Status: AC

## 2011-10-31 MED ORDER — ACETAMINOPHEN 325 MG PO TABS: 325.0000 mg | ORAL_TABLET | ORAL | Status: DC | PRN

## 2011-10-31 MED ORDER — OXYTOCIN 10 UNIT/ML IJ SOLN
INTRAMUSCULAR | Status: AC
  Filled 2011-10-31: qty 2

## 2011-10-31 MED ORDER — ONDANSETRON HCL 4 MG/2ML IJ SOLN: 4.0000 mg | INTRAMUSCULAR | Status: DC | PRN

## 2011-10-31 MED ORDER — IBUPROFEN 600 MG PO TABS
600.0000 mg | ORAL_TABLET | Freq: Four times a day (QID) | ORAL | Status: DC
  Administered 2011-10-31 – 2011-11-02 (×9): 600 mg via ORAL
  Filled 2011-10-31 (×9): qty 1

## 2011-10-31 MED ORDER — NALOXONE HCL 0.4 MG/ML IJ SOLN: 0.4000 mg | INTRAMUSCULAR | Status: DC | PRN

## 2011-10-31 MED ORDER — PRENATAL MULTIVITAMIN CH
1.0000 | ORAL_TABLET | Freq: Every day | ORAL | Status: DC
  Administered 2011-10-31 – 2011-11-02 (×3): 1 via ORAL
  Filled 2011-10-31 (×3): qty 1

## 2011-10-31 MED ORDER — DIBUCAINE 1 % RE OINT: 1.0000 "application " | TOPICAL_OINTMENT | RECTAL | Status: DC | PRN

## 2011-10-31 SURGICAL SUPPLY — 54 items
ADH SKN CLS APL DERMABOND .7 (GAUZE/BANDAGES/DRESSINGS)
APL SKNCLS STERI-STRIP NONHPOA (GAUZE/BANDAGES/DRESSINGS)
BENZOIN TINCTURE PRP APPL 2/3 (GAUZE/BANDAGES/DRESSINGS) IMPLANT
BLADE EXTENDED COATED 6.5IN (ELECTRODE) IMPLANT
BLADE HEX COATED 2.75 (ELECTRODE) IMPLANT
BOOTIES KNEE HIGH SLOAN (MISCELLANEOUS) ×4 IMPLANT
CHLORAPREP W/TINT 26ML (MISCELLANEOUS) ×2 IMPLANT
CLOTH BEACON ORANGE TIMEOUT ST (SAFETY) ×2 IMPLANT
CONTAINER PREFILL 10% NBF 15ML (MISCELLANEOUS) ×4 IMPLANT
DERMABOND ADVANCED (GAUZE/BANDAGES/DRESSINGS)
DERMABOND ADVANCED .7 DNX12 (GAUZE/BANDAGES/DRESSINGS) IMPLANT
DRAIN JACKSON PRT FLT 7MM (DRAIN) IMPLANT
DRESSING TELFA 8X3 (GAUZE/BANDAGES/DRESSINGS) ×1 IMPLANT
DRSG COVADERM 4X10 (GAUZE/BANDAGES/DRESSINGS) ×1 IMPLANT
ELECT REM PT RETURN 9FT ADLT (ELECTROSURGICAL) ×2
ELECTRODE REM PT RTRN 9FT ADLT (ELECTROSURGICAL) ×1 IMPLANT
EVACUATOR SILICONE 100CC (DRAIN) IMPLANT
EXTRACTOR VACUUM KIWI (MISCELLANEOUS) IMPLANT
EXTRACTOR VACUUM M CUP 4 TUBE (SUCTIONS) IMPLANT
GAUZE SPONGE 4X4 12PLY STRL LF (GAUZE/BANDAGES/DRESSINGS) ×4 IMPLANT
GLOVE ECLIPSE 7.5 STRL STRAW (GLOVE) ×1 IMPLANT
GLOVE SURG SS PI 6.5 STRL IVOR (GLOVE) ×4 IMPLANT
GLOVE SURG SS PI 7.5 STRL IVOR (GLOVE) ×1 IMPLANT
GOWN PREVENTION PLUS LG XLONG (DISPOSABLE) ×6 IMPLANT
KIT ABG SYR 3ML LUER SLIP (SYRINGE) IMPLANT
NDL HYPO 25X5/8 SAFETYGLIDE (NEEDLE) ×1 IMPLANT
NDL SPNL 22GX3.5 QUINCKE BK (NEEDLE) ×1 IMPLANT
NEEDLE HYPO 25X5/8 SAFETYGLIDE (NEEDLE) ×2 IMPLANT
NEEDLE SPNL 22GX3.5 QUINCKE BK (NEEDLE) ×2 IMPLANT
NS IRRIG 1000ML POUR BTL (IV SOLUTION) ×2 IMPLANT
PACK C SECTION WH (CUSTOM PROCEDURE TRAY) ×2 IMPLANT
PAD ABD 7.5X8 STRL (GAUZE/BANDAGES/DRESSINGS) ×1 IMPLANT
RTRCTR C-SECT PINK 25CM LRG (MISCELLANEOUS) ×1 IMPLANT
SLEEVE SCD COMPRESS KNEE MED (MISCELLANEOUS) ×1 IMPLANT
SPONGE GAUZE 4X4 12PLY (GAUZE/BANDAGES/DRESSINGS) ×1 IMPLANT
STRIP CLOSURE SKIN 1/4X4 (GAUZE/BANDAGES/DRESSINGS) IMPLANT
SUT CHROMIC 2 0 SH (SUTURE) ×2 IMPLANT
SUT MNCRL AB 3-0 PS2 27 (SUTURE) ×2 IMPLANT
SUT SILK 0 FSL (SUTURE) IMPLANT
SUT VIC AB 0 CT1 27 (SUTURE) ×4
SUT VIC AB 0 CT1 27XBRD ANBCTR (SUTURE) ×2 IMPLANT
SUT VIC AB 0 CT1 36 (SUTURE) IMPLANT
SUT VIC AB 0 CTX 36 (SUTURE) ×4
SUT VIC AB 0 CTX36XBRD ANBCTRL (SUTURE) IMPLANT
SUT VIC AB 0 CTXB 36 (SUTURE) IMPLANT
SUT VIC AB 2-0 CT1 27 (SUTURE) ×4
SUT VIC AB 2-0 CT1 TAPERPNT 27 (SUTURE) ×2 IMPLANT
SUT VIC AB 2-0 SH 27 (SUTURE)
SUT VIC AB 2-0 SH 27XBRD (SUTURE) IMPLANT
SYR CONTROL 10ML LL (SYRINGE) ×2 IMPLANT
TAPE CLOTH SURG 4X10 WHT LF (GAUZE/BANDAGES/DRESSINGS) ×1 IMPLANT
TOWEL OR 17X24 6PK STRL BLUE (TOWEL DISPOSABLE) ×4 IMPLANT
TRAY FOLEY CATH 14FR (SET/KITS/TRAYS/PACK) ×1 IMPLANT
WATER STERILE IRR 1000ML POUR (IV SOLUTION) ×2 IMPLANT

## 2011-10-31 NOTE — Transfer of Care (Signed)
Immediate Anesthesia Transfer of Care Note  Patient: Madeline Walker  Procedure(s) Performed: Procedure(s) (LRB): CESAREAN SECTION (N/A)  Patient Location: PACU  Anesthesia Type: Epidural  Level of Consciousness: awake, alert  and oriented  Airway & Oxygen Therapy: Patient Spontanous Breathing  Post-op Assessment: Report given to PACU RN and Post -op Vital signs reviewed and stable  Post vital signs: Reviewed and stable  Complications: No apparent anesthesia complications

## 2011-10-31 NOTE — Anesthesia Postprocedure Evaluation (Signed)
Anesthesia Post Note  Patient: Madeline Walker  Procedure(s) Performed: Procedure(s) (LRB): CESAREAN SECTION (N/A)  Anesthesia type: Epidural  Patient location: PACU  Post pain: Pain level controlled  Post assessment: Post-op Vital signs reviewed  Last Vitals:  Filed Vitals:   10/31/11 0245  BP: 116/43  Pulse: 77  Temp:   Resp: 13    Post vital signs: Reviewed  Level of consciousness: awake  Complications: No apparent anesthesia complications

## 2011-10-31 NOTE — Progress Notes (Signed)
The pt has been complete for almost 4 hours.  She pushed for an hour without much progress.  Vtx remains at -2 station.  MVUs inadequate on IUPC.  Fetus not tolerating postion changes without decelerations.  Non reassuring status precludes the use of pitocin. CS is recommended. Indications, risks and benefits reviewed and questions answered.  Pt consents to proceed.

## 2011-10-31 NOTE — Progress Notes (Signed)
Patient ID: Madeline Walker, female   DOB: [birth year], 41 y.o.   MRN: [id number] .Subjective:  Remains comfortable with epidural  Objective: BP 116/54  Pulse 93  Resp 20  Ht 5' 8.5" (1.74 m)  Wt 151 lb (68.493 kg)  BMI 22.63 kg/m2  LMP 01/29/2011   FHT:  FHR: 140 bpm, variability: moderate,  accelerations:  Present,  decelerations:  Present variables UC:   irregular, every 4-5 minutes SVE:   Dilation: 10 Effacement (%): 100 Station: -2 Exam by:: S Laguana Desautel CNM  MVU 150's  Assessment / Plan: Arrest of decent GBS pos   Fetal Wellbeing:  Category II Pain Control:  Epidural  Pt agrees to C/S, rv'd Risks of bleeding, infection, anesthesia complications, or damage to internal organs and pt agrees to proceed Madeline Walker M 10/31/2011, 12:39 AM

## 2011-10-31 NOTE — Anesthesia Postprocedure Evaluation (Signed)
  Anesthesia Post Note  Patient: Madeline Walker  Procedure(s) Performed: Procedure(s) (LRB): CESAREAN SECTION (N/A)  Anesthesia type: Epidural  Patient location: Mother/Baby  Post pain: Pain level controlled  Post assessment: Post-op Vital signs reviewed  Last Vitals:  Filed Vitals:   10/31/11 1340  BP: 114/62  Pulse: 85  Temp: 36.6 C  Resp: 20    Post vital signs: Reviewed  Level of consciousness:alert  Complications: No apparent anesthesia complications

## 2011-10-31 NOTE — Op Note (Signed)
Cesarean Section Procedure Note  Indications: failure to progress: arrest of descent  Pre-operative Diagnosis: 39 week 2 day pregnancy.  Post-operative Diagnosis: same  Surgeon: Hal Morales  First Assistant:  Surgeon: Hal Morales   Assistants: Cline Crock Surgical Tech  Anesthesia: Epidural anesthesia  ASA Class: 2  Procedure Details  The patient was seen in the Holding Room. The risks, benefits, complications, treatment options, and expected outcomes were discussed with the patient.  The patient concurred with the proposed plan, giving informed consent.  The site of surgery properly noted/marked. The patient was taken to Operating Room # 1, identified as Madeline Walker and the procedure verified as C-Section Delivery. A Time Out was held and the above information confirmed.  After induction of anesthesia, the patient was  prepped with Chloraprep in the usual sterile manner.A foley catheter was placed under sterile conditions.  The patient was then draped in the usual fashion.   Suprapubicsubcutaneous injection of 0.25% Bupivacaine   A Pfannenstiel incision was made and carried down through the subcutaneous tissue to the fascia. Fascial incision was made and extended transversely. The fascia was separated from the underlying rectus tissue superiorly and inferiorly. The peritoneum was identified and entered. Peritoneal incision was extended longitudinally.  The bladder blade was placed.   A low transverse uterine incision was made two cm above the uterovesical fold, and that incision extended transversely bluntly.The infant was  delivered from OT presentation was a female infant with weight pending at the time of dictation; Apgars 9 and 10 at one and at five minutes. After the umbilical cord was clamped and cut cord blood was obtained for evaluation. The placenta was removed intact and appeared normal. The uterine outline, tubes and ovaries appeared normal for the gravid state.  The uterine incision was closed with running locked sutures of 0 Vicryl. An imbricating layer of sutures was placed. Hemostasis was observed. Lavage was carried out until clear. The peritoneum was closed with a running suture of 2-0 Vicryl.  The rectus muscles were reapproximated with a figure of 8 suture of 2-0 Vicryl.  The fascia was then reapproximated with a running sutures of 0 Vicryl .Renforcing figure of 8 sutures of 0Vicryl were placed on either side of midline.   The skin was reapproximated with 3-0 moncryl.  A sterile dressing was applied.  Instrument, sponge, and needle counts were correct prior to the abdominal closure and at the conclusion of the case.   Findings:  Placenta contained a 3 vessel cord    Estimated Blood Loss:  750cc         Drains: na          Total IV Fluids:         Specimens: Placenta to path         Implants: na         Complications: ::"None; patient tolerated the procedure well."         Disposition: PACU - hemodynamically stable.         Condition: stable  Attending Attestation: I performed the procedure.  Estill Llerena P  10/31/2011 2:35 AM

## 2011-10-31 NOTE — Addendum Note (Signed)
Addendum  created 10/31/11 1751 by Algis Greenhouse, CRNA   Modules edited:Notes Section

## 2011-10-31 NOTE — Progress Notes (Signed)
Dr. Pennie Rushing at bedside discussing cs with pt

## 2011-10-31 NOTE — Progress Notes (Signed)
Patient ID: Madeline Walker, female   DOB: Nov 15, 1970, 41 y.o.   MRN: [id number] .Subjective:  Denies pain, feels upset about not progressing   Objective: BP 115/45  Pulse 142  Resp 20  Ht 5' 8.5" (1.74 m)  Wt 151 lb (68.493 kg)  BMI 22.63 kg/m2  LMP 01/29/2011   FHT:  FHR: 130 bpm, variability: moderate,  accelerations:  Present,  decelerations:  Present occ late decels, variables UC:   irregular, every 3-5 minutes SVE:   Dilation: 10 Effacement (%): 100 Station: -2 Exam by:: Madeline Walker CNM  IUPC placed without difficulty, no further descent noted  Assessment / Plan: Arrest of decent GBS pos, rcv'd ampicilin  rv'd options w pt including rec IUPC to determine intensity of ctx, and perhaps pitocin augmentation Pt wanted to attempt upright position however FHR decel to 60'Madeline with slow return to baseline   Fetal Wellbeing:  Category II Pain Control:  Epidural  Dr Madeline Walker updated  Madeline Walker 10/31/2011, 12:11 AM

## 2011-10-31 NOTE — Progress Notes (Signed)
S.Lilliard at bedside discussing options with pt for delivery, pt refusing to have repeat cs. Pt wanting to discuss her risk with husband and Doula.

## 2011-10-31 NOTE — Addendum Note (Signed)
Addendum  created 10/31/11 0338 by Velna Hatchet, MD   Modules edited:Orders, PRL Based Order Sets

## 2011-11-01 ENCOUNTER — Encounter (HOSPITAL_COMMUNITY): Payer: Self-pay | Admitting: Obstetrics and Gynecology

## 2011-11-01 LAB — CBC
HCT: 31 % — ABNORMAL LOW (ref 36.0–46.0)
Hemoglobin: 10.4 g/dL — ABNORMAL LOW (ref 12.0–15.0)
MCH: 32 pg (ref 26.0–34.0)
MCHC: 33.5 g/dL (ref 30.0–36.0)
RBC: 3.25 MIL/uL — ABNORMAL LOW (ref 3.87–5.11)

## 2011-11-01 NOTE — Progress Notes (Signed)
Subjective: Postpartum Day 1: Cesarean Delivery Patient reports incisional pain and tolerating PO.    Objective: Vital signs in last 24 hours: Temp:  [97.9 F (36.6 C)-98.3 F (36.8 C)] 97.9 F (36.6 C) (05/02 0644) Pulse Rate:  [64-100] 70  (05/02 0644) Resp:  [16-22] 20  (05/02 0644) BP: (94-127)/(48-70) 94/55 mmHg (05/02 0644) SpO2:  [98 %-99 %] 98 % (05/02 0205)  Physical Exam:  General: alert and cooperative Lochia: appropriate Uterine Fundus: firm Incision: Dressing clean and dry. DVT Evaluation: No evidence of DVT seen on physical exam.   Basename 11/01/11 0555 10/30/11 2108  HGB 10.4* 13.8  HCT 31.0* 40.6    Assessment/Plan: Status post Cesarean section. Doing well postoperatively.  Continue current care. Post op anemia circ discussed  Deloy Archey V 11/01/2011, 9:54 AM

## 2011-11-02 MED ORDER — OXYCODONE-ACETAMINOPHEN 5-325 MG PO TABS: 1.0000 | ORAL_TABLET | ORAL | Status: AC | PRN

## 2011-11-02 MED ORDER — IBUPROFEN 600 MG PO TABS: 600.0000 mg | ORAL_TABLET | Freq: Four times a day (QID) | ORAL | Status: AC | PRN

## 2011-11-02 NOTE — Discharge Instructions (Signed)
Cesarean Delivery  Cesarean delivery is the birth of a baby through a cut (incision) in the abdomen and womb (uterus).  LET YOUR CAREGIVER KNOW ABOUT:  Complicationsinvolving the pregnancy.   Allergies.   Medicines taken including herbs, eyedrops, over-the-counter medicines, and creams.   Use of steroids (by mouth or creams).   Previous problems with anesthetics or numbing medicine.   Previous surgery.   History of blood clots.   History of bleeding or blood problems.   Other health problems.  Breastfeeding BENEFITS OF BREASTFEEDING For the baby The first milk (colostrum) helps the baby's digestive system function better.  There are antibodies from the mother in the milk that help the baby fight off infections.  The baby has a lower incidence of asthma, allergies, and SIDS (sudden infant death syndrome).  The nutrients in breast milk are better than formulas for the baby and helps the baby's brain grow better.  Babies who breastfeed have less gas, colic, and constipation.  For the mother Breastfeeding helps develop a very special bond between mother and baby.  It is more convenient, always available at the correct temperature and cheaper than formula feeding.  It burns calories in the mother and helps with losing weight that was gained during pregnancy.  It makes the uterus contract back down to normal size faster and slows bleeding following delivery.  Breastfeeding mothers have a lower risk of developing breast cancer.  NURSE FREQUENTLY A healthy, full-term baby may breastfeed as often as every hour or space his or her feedings to every 3 hours.  How often to nurse will vary from baby to baby. Watch your baby for signs of hunger, not the clock.  Nurse as often as the baby requests, or when you feel the need to reduce the fullness of your breasts.  Awaken the baby if it has been 3 to 4 hours since the last feeding.  Frequent feeding will help the mother make more milk and  will prevent problems like sore nipples and engorgement of the breasts.  BABY'S POSITION AT THE BREAST Whether lying down or sitting, be sure that the baby's tummy is facing your tummy.  Support the breast with 4 fingers underneath the breast and the thumb above. Make sure your fingers are well away from the nipple and baby's mouth.  Stroke the baby's lips and cheek closest to the breast gently with your finger or nipple.  When the baby's mouth is open wide enough, place all of your nipple and as much of the dark area around the nipple as possible into your baby's mouth.  Pull the baby in close so the tip of the nose and the baby's cheeks touch the breast during the feeding.  FEEDINGS The length of each feeding varies from baby to baby and from feeding to feeding.  The baby must suck about 2 to 3 minutes for your milk to get to him or her. This is called a "let down." For this reason, allow the baby to feed on each breast as long as he or she wants. Your baby will end the feeding when he or she has received the right balance of nutrients.  To break the suction, put your finger into the corner of the baby's mouth and slide it between his or her gums before removing your breast from his or her mouth. This will help prevent sore nipples.  REDUCING BREAST ENGORGEMENT In the first week after your baby is born, you may experience signs of breast  engorgement. When breasts are engorged, they feel heavy, warm, full, and may be tender to the touch. You can reduce engorgement if you:  Nurse frequently, every 2 to 3 hours. Mothers who breastfeed early and often have fewer problems with engorgement.  Place light ice packs on your breasts between feedings. This reduces swelling. Wrap the ice packs in a lightweight towel to protect your skin.  Apply moist hot packs to your breast for 5 to 10 minutes before each feeding. This increases circulation and helps the milk flow.  Gently massage your breast before and  during the feeding.  Make sure that the baby empties at least one breast at every feeding before switching sides.  Use a breast pump to empty the breasts if your baby is sleepy or not nursing well. You may also want to pump if you are returning to work or or you feel you are getting engorged.  Avoid bottle feeds, pacifiers or supplemental feedings of water or juice in place of breastfeeding.  Be sure the baby is latched on and positioned properly while breastfeeding.  Prevent fatigue, stress, and anemia.  Wear a supportive bra, avoiding underwire styles.  Eat a balanced diet with enough fluids.  If you follow these suggestions, your engorgement should improve in 24 to 48 hours. If you are still experiencing difficulty, call your lactation consultant or caregiver. IS MY BABY GETTING ENOUGH MILK? Sometimes, mothers worry about whether their babies are getting enough milk. You can be assured that your baby is getting enough milk if: The baby is actively sucking and you hear swallowing.  The baby nurses at least 8 to 12 times in a 24 hour time period. Nurse your baby until he or she unlatches or falls asleep at the first breast (at least 10 to 20 minutes), then offer the second side.  The baby is wetting 5 to 6 disposable diapers (6 to 8 cloth diapers) in a 24 hour period by 61 to 14 days of age.  The baby is having at least 2 to 3 stools every 24 hours for the first few months. Breast milk is all the food your baby needs. It is not necessary for your baby to have water or formula. In fact, to help your breasts make more milk, it is best not to give your baby supplemental feedings during the early weeks.  The stool should be soft and yellow.  The baby should gain 4 to 7 ounces per week after he is 31 days old.  TAKE CARE OF YOURSELF Take care of your breasts by: Bathing or showering daily.  Avoiding the use of soaps on your nipples.  Start feedings on your left breast at one feeding and on your right  breast at the next feeding.  You will notice an increase in your milk supply 2 to 5 days after delivery. You may feel some discomfort from engorgement, which makes your breasts very firm and often tender. Engorgement "peaks" out within 24 to 48 hours. In the meantime, apply warm moist towels to your breasts for 5 to 10 minutes before feeding. Gentle massage and expression of some milk before feeding will soften your breasts, making it easier for your baby to latch on. Wear a well fitting nursing bra and air dry your nipples for 10 to 15 minutes after each feeding.  Only use cotton bra pads.  Only use pure lanolin on your nipples after nursing. You do not need to wash it off before nursing.  Take  care of yourself by:  Eating well-balanced meals and nutritious snacks.  Drinking milk, fruit juice, and water to satisfy your thirst (about 8 glasses a day).  Getting plenty of rest.  Increasing calcium in your diet (1200 mg a day).  Avoiding foods that you notice affect the baby in a bad way.  SEEK MEDICAL CARE IF:  You have any questions or difficulty with breastfeeding.  You need help.  You have a hard, red, sore area on your breast, accompanied by a fever of 100.5 F (38.1 C) or more.  Your baby is too sleepy to eat well or is having trouble sleeping.  Your baby is wetting less than 6 diapers per day, by 36 days of age.  Your baby's skin or white part of his or her eyes is more yellow than it was in the hospital.  You feel depressed.  Document Released: 06/18/2005 Document Revised: 06/07/2011 Document Reviewed: 01/31/2009 [facility] Patient Information 2012 [place], [place]. RISKS AND COMPLICATIONS   Bleeding.   Infection.   Blood clots.   Injury to surrounding organs.   Anesthesia problems.   Injury to the baby.  BEFORE THE PROCEDURE   A tube (Foley catheter) will be placed in your bladder. The Foley catheter drains the urine from your bladder into a bag. This keeps your bladder empty  during surgery.   An intravenous access tube (IV) will be placed in your arm.   Hair may be removed from your pubic area and your lower abdomen. This is to prevent infection in the incision site.   You may be given an antacid medicine to drink. This will prevent acid contents in your stomach from going into your lungs if you vomit during the surgery.   You may be given an antibiotic medicine to prevent infection.  PROCEDURE   You may be given medicine to numb the lower half of your body (regional anesthetic). If you were in labor, you may have already had an epidural in place which can be used in both labor and cesarean delivery. You may possibly be given medicine to make you sleep (general anesthetic) though this is not as common.   An incision will be made in your abdomen that extends to your uterus. There are 2 basic kinds of incisions:   The horizontal (transverse) incision. Horizontal incisions are used for most routine cesarean deliveries.   The vertical (up and down) incision. This is less commonly used. This is most often reserved for women who have a serious complication (extreme prematurity) or under emergency situations.   The horizontal and vertical incisions may both be used at the same time. However, this is very uncommon.   Your baby will then be delivered.  AFTER THE PROCEDURE   If you were awake during the surgery, you will see your baby right away. If you were asleep, you will see your baby as soon as you are awake.   You may breastfeed your baby after surgery.   You may be able to get up and walk the same day as the surgery. If you need to stay in bed for a period of time, you will receive help to turn, cough, and take deep breaths after surgery. This helps prevent lung problems such as pneumonia.   Do not get out of bed alone the first time after surgery. You will need help getting out of bed until you are able to do this by yourself.   You may be able to shower  the day after your cesarean delivery. After the bandage (dressing) is taken off the incision site, a nurse will assist you to shower, if you like.   You will have pneumatic compressing hose placed on your feet or lower legs. These hose are used to prevent blood clots. When you are up and walking regularly, they will no longer be necessary.   Do not cross your legs when you sit.   Save any blood clots that you pass. If you pass a clot while on the toilet, do not flush it. Call for the nurse. Tell the nurse if you think you are bleeding too much or passing too many clots.   Start drinking liquids and eating food as directed by your caregiver. If your stomach is not ready, drinking and eating too soon can cause an increase in bloating and swelling of your intestine and abdomen. This is very uncomfortable.   You will be given medicine as needed. Let your caregivers know if you are hurting. They want you to be comfortable. You may also be given an antibiotic to prevent an infection.   Your IV will be taken out when you are drinking a reasonable amount of fluids. The Foley catheter is taken out when you are up and walking.   If your blood type is Rh negative and your baby's blood type is Rh positive, you will be given a shot of anti-D immune globulin. This shot prevents you from having Rh problems with a future pregnancy. You should get the shot even if you had your tubes tied (tubal ligation).   If you are allowed to take the baby for a walk, place the baby in the bassinet and push it. Do not carry your baby in your arms.  Document Released: 06/18/2005 Document Revised: 06/07/2011 Document Reviewed: 10/13/2010 [facility] Patient Information 2012 [place], [place].

## 2011-11-02 NOTE — Discharge Summary (Signed)
Physician Discharge Summary  Patient ID: Madeline [name] MRN: [id number] DOB/AGE: [birth year] 41 y.o.  Admit date: 10/30/2011 Discharge date: 11/02/2011  Admission Diagnoses:term pg, 2nd stage, SROM  Discharge Diagnoses:   Active Problems:  Status post repeat low transverse cesarean section lactating Normal involution  Discharged Condition: stable  Hospital Course: term pg, 2nd stage, SROM, repeat c/s for FTP  Consults: None  Significant Diagnostic Studies: labs:  Results for orders placed during the hospital encounter of 10/30/11 (from the past 72 hour(s))  CBC     Status: Abnormal   Collection Time   10/30/11  9:08 PM      Component Value Range Comment   WBC 14.8 (*) 4.0 - 10.5 (K/uL)    RBC 4.27  3.87 - 5.11 (MIL/uL)    Hemoglobin 13.8  12.0 - 15.0 (g/dL)    HCT 11.9  14.7 - 82.9 (%)    MCV 95.1  78.0 - 100.0 (fL)    MCH 32.3  26.0 - 34.0 (pg)    MCHC 34.0  30.0 - 36.0 (g/dL)    RDW 56.2  13.0 - 86.5 (%)    Platelets 227  150 - 400 (K/uL)   RPR     Status: Normal   Collection Time   10/30/11  9:08 PM      Component Value Range Comment   RPR NON REACTIVE  NON REACTIVE    CBC     Status: Abnormal   Collection Time   11/01/11  5:55 AM      Component Value Range Comment   WBC 14.0 (*) 4.0 - 10.5 (K/uL)    RBC 3.25 (*) 3.87 - 5.11 (MIL/uL)    Hemoglobin 10.4 (*) 12.0 - 15.0 (g/dL)    HCT 78.4 (*) 69.6 - 46.0 (%)    MCV 95.4  78.0 - 100.0 (fL)    MCH 32.0  26.0 - 34.0 (pg)    MCHC 33.5  30.0 - 36.0 (g/dL)    RDW 29.5  28.4 - 13.2 (%)    Platelets 151  150 - 400 (K/uL)     Treatments: IV hydration  Discharge Exam: Blood pressure 112/67, pulse 73, temperature 98.3 F (36.8 C), temperature source Oral, resp. rate 18, height 5' 8.5" (1.74 m), weight 151 lb (68.493 kg), last menstrual period 01/29/2011, SpO2 98.00%, unknown if currently breastfeeding. Extremities: Homans sign is negative, no sign of DVT, Calm, no distress, lungs clear bilaterally, AP RRR, abd soft, gravid,  nt, bowel sounds active no edema to lower extremities, FF sm serosa flow, incision [name] approximated no redness, edema, or drainage  Disposition: home with baby   Medication List  As of 11/02/2011  2:14 PM   TAKE these medications         ibuprofen 600 MG tablet   Commonly known as: ADVIL,MOTRIN   Take 1 tablet (600 mg total) by mouth every 6 (six) hours as needed for pain.      oxyCODONE-acetaminophen 5-325 MG per tablet   Commonly known as: PERCOCET   Take 1-2 tablets by mouth every 3 (three) hours as needed (moderate - severe pain).      prenatal multivitamin Tabs   Take 1 tablet by mouth daily.           Follow-up Information    Follow up with [facility] in 6 weeks.         SignedLavera Guise 11/02/2011, 2:14 PM

## 2011-11-06 ENCOUNTER — Encounter: Payer: BC Managed Care – PPO | Admitting: Obstetrics and Gynecology

## 2011-11-07 ENCOUNTER — Encounter (HOSPITAL_COMMUNITY)
Admission: RE | Admit: 2011-11-07 | Discharge: 2011-11-07 | Disposition: A | Discharge status: Home / Self Care | Payer: BC Managed Care – PPO | Source: Ambulatory Visit | Attending: Obstetrics and Gynecology | Admitting: Obstetrics and Gynecology

## 2011-11-07 DIAGNOSIS — O923 Agalactia: Secondary | ICD-10-CM | POA: Insufficient documentation

## 2011-12-06 ENCOUNTER — Encounter: Payer: Self-pay | Admitting: Obstetrics and Gynecology

## 2011-12-06 ENCOUNTER — Ambulatory Visit (INDEPENDENT_AMBULATORY_CARE_PROVIDER_SITE_OTHER): Payer: BC Managed Care – PPO | Admitting: Obstetrics and Gynecology

## 2011-12-06 VITALS — BP 102/58 | Ht 69.0 in | Wt 128.0 lb

## 2011-12-06 DIAGNOSIS — O9270 Unspecified disorders of lactation: Secondary | ICD-10-CM

## 2011-12-06 DIAGNOSIS — IMO0002 Reserved for concepts with insufficient information to code with codable children: Secondary | ICD-10-CM

## 2011-12-06 DIAGNOSIS — O99345 Other mental disorders complicating the puerperium: Secondary | ICD-10-CM | POA: Insufficient documentation

## 2011-12-06 DIAGNOSIS — F53 Postpartum depression: Secondary | ICD-10-CM | POA: Insufficient documentation

## 2011-12-06 DIAGNOSIS — K625 Hemorrhage of anus and rectum: Secondary | ICD-10-CM

## 2011-12-06 MED ORDER — HYDROCORTISONE ACE-PRAMOXINE 1-1 % RE FOAM: RECTAL | Status: DC

## 2011-12-06 NOTE — Progress Notes (Signed)
Madeline Walker  is 5 weeks postpartum following a primary cesarean section, low transverse incision at 39 gestational weeks Date: 10/31/2011 female baby named Jonathon delivered by [employer].  Breastfeeding: yes Bottlefeeding:  yes  Post-partum blues / depression:  yes  EPDS score: 13 History of abnormal Pap:  no  Last Pap: Date  01/2011 Gestational diabetes:  no  Contraception:  Desires no method  Normal urinary function:  no Normal GI function:  Believes she has hemorrhoids, still bleeding when she uses restroom.  Returning to work:  no      Subjective:     Madeline Walker is a 41 y.o. female who presents for a postpartum visit.  I have fully reviewed the prenatal and intrapartum course. The pt admits she is grieving not having a vaginal delivery and is overwhelmed by lactation difficulty.  She doesn't have support or respite from her son.   Patient is not sexually active.   The following portions of the patient's history were reviewed and updated as appropriate: allergies, current medications, past family history, past medical history, past social history, past surgical history and problem list.  Review of Systems Pertinent items are noted in HPI.   Objective:    BP 102/58  Ht 5\' 9"  (1.753 m)  Wt 128 lb (58.06 kg)  BMI 18.90 kg/m2  Breastfeeding? Yes  General:  alert, cooperative and no distress     Lungs: clear to auscultation bilaterally  Heart:  regular rate and rhythm, S1, S2 normal, no murmur  Abdomen: soft, non-tender; bowel sounds normal; no masses,  no organomegaly  Incision healed well   Vulva:  normal  Vagina: normal vagina with brown discharge in the vault  Cervix:  normal  Corpus: normal size, contour, position, consistency, mobility, non-tender  Adnexa:  normal adnexa             Assessment:    probable situational postpartum depression Normal postpartum exam.  Pap smear not done at today's visit.  lactation difficulty   Plan:  Counseling  offered.  Pt has established contact secondary to her son's autism Recommended baby sitter for respite   . Contraception: none . Follow up in: at [facility]  or as needed.    Diyan Dave P MD 12/06/2011 1:25 PM

## 2011-12-10 ENCOUNTER — Telehealth: Payer: Self-pay | Admitting: Obstetrics and Gynecology

## 2011-12-10 NOTE — Telephone Encounter (Signed)
Chandra/cht received 

## 2011-12-11 NOTE — Telephone Encounter (Signed)
Tc to pt per telephone call. Pt started exercising the day after her post partum visit and started bldg. Pt unsure if this is her cycle or if this is due to exercising. Informed pt to monitor bldg and cb if bldg increases(changing >1pad hourly), abd pain, fever occurs or if bldg persist. Pt voices understanding.

## 2011-12-31 ENCOUNTER — Telehealth: Payer: Self-pay | Admitting: Obstetrics and Gynecology

## 2011-12-31 NOTE — Telephone Encounter (Signed)
Tc to pt per telephone call. Pt c/o vaginal discomfort with ic. Pt had a c-section on 10/31/11 and last pm was the first time pt had ic. No abd pain. No abn vag bldg. Pt is breastfeeding. Informed pt to continue ic and can add lubrication if needed. Pt aware breastfeeding and the lack of using vaginal muscles can contribute to discomfort with ic.  Pt to perform Kegel exercises. If no improvement, pt to call the office for eval. Pt voices understanding.

## 2012-01-16 ENCOUNTER — Telehealth: Payer: Self-pay | Admitting: Obstetrics and Gynecology

## 2012-01-16 ENCOUNTER — Ambulatory Visit (INDEPENDENT_AMBULATORY_CARE_PROVIDER_SITE_OTHER): Payer: BC Managed Care – PPO | Admitting: Obstetrics and Gynecology

## 2012-01-16 ENCOUNTER — Encounter: Payer: Self-pay | Admitting: Obstetrics and Gynecology

## 2012-01-16 VITALS — BP 98/58 | Temp 98.7°F | Ht 69.0 in | Wt 129.5 lb

## 2012-01-16 DIAGNOSIS — N952 Postmenopausal atrophic vaginitis: Secondary | ICD-10-CM

## 2012-01-16 DIAGNOSIS — N949 Unspecified condition associated with female genital organs and menstrual cycle: Secondary | ICD-10-CM

## 2012-01-16 DIAGNOSIS — R102 Pelvic and perineal pain: Secondary | ICD-10-CM

## 2012-01-16 LAB — POCT WET PREP (WET MOUNT)
Clue Cells Wet Prep Whiff POC: NEGATIVE
Whiff Test: NEGATIVE

## 2012-01-16 MED ORDER — ESTRADIOL 10 MCG VA TABS: 10.0000 ug | ORAL_TABLET | VAGINAL | Status: DC

## 2012-01-16 NOTE — Telephone Encounter (Signed)
CHANDRA/[facility] PT °

## 2012-01-16 NOTE — Telephone Encounter (Signed)
Spoke with pt per telephone call. Pt still c/o persistent sever vaginal pain with IC. Pt had a c-section on 10/31/11. No fever. Appt sched today with [facility] for eval @ 3:30p. Pt voices understanding.

## 2012-01-16 NOTE — Progress Notes (Signed)
Subjective: Pt c/o internal vaginal pain and burning since delivery in 10/2011. Pt tried to resume IC x 2weeks ago with difficulty. No vaginal discharge. No fever. Pt c/o sx when sitting, riding in a car, as well with IC. Has tried vaginal dilators, but with discomfort.  Objective: BP 98/58  Temp 98.7 F (37.1 C)  Ht 5\' 9"  (1.753 m)  Wt 129 lb 8 oz (58.741 kg)  BMI 19.12 kg/m2  Breastfeeding? Yes  Vulva: Mild erythema.  Green discharge Vagina: Significant erythema.  Discharge Cervix: No lesion  Wet Prep:neg  Impression:  Severe atrophic vaginitis  Recommendation: Vagifem 10 mcgm per vagina 2x weekly EVOO for lubrication between applications

## 2012-02-21 ENCOUNTER — Ambulatory Visit (INDEPENDENT_AMBULATORY_CARE_PROVIDER_SITE_OTHER): Payer: BC Managed Care – PPO | Admitting: Obstetrics and Gynecology

## 2012-02-21 ENCOUNTER — Encounter: Payer: Self-pay | Admitting: Obstetrics and Gynecology

## 2012-02-21 VITALS — BP 102/58 | Temp 98.0°F | Ht 69.0 in | Wt 128.0 lb

## 2012-02-21 DIAGNOSIS — O91219 Nonpurulent mastitis associated with pregnancy, unspecified trimester: Secondary | ICD-10-CM

## 2012-02-21 DIAGNOSIS — O9122 Nonpurulent mastitis associated with the puerperium: Secondary | ICD-10-CM | POA: Insufficient documentation

## 2012-02-21 DIAGNOSIS — N61 Mastitis without abscess: Secondary | ICD-10-CM

## 2012-02-21 NOTE — Progress Notes (Signed)
Subjective:  AEX:  Last Pap: 02/07/11 WNL: Yes Regular Periods:no Contraception: None  Monthly Breast exam:yes Tetanus<68yrs:yes Nl.Bladder Function:yes Daily BMs:yes Healthy Diet:yes Calcium:no Mammogram:yes Date of Mammogram: 12/29/2009 Exercise:yes Have often Exercise: occasional Seatbelt: yes Abuse at home: no Stressful work:no Sigmoid-colonoscopy: N/a Bone Density: No PCP: Dr. Zachery Dauer Change in PMH: None Change in [facility]: None    Madeline Walker is a 41 y.o. female, [id number], who presents for an annual exam. C/o 2 day hx of left breast redness and tenderness, but no pain with nursing.Had temperature of 101 yesterday, but resolved.  Didn't use vaginal estrogen    History   Social History  . Marital Status: Married    Spouse Name: N/A    Number of Children: N/A  . Years of Education: N/A   Social History Main Topics  . Smoking status: Never Smoker   . Smokeless tobacco: Never Used  . Alcohol Use: No  . Drug Use: No  . Sexually Active: Not Currently    Birth Control/ Protection: None   Other Topics Concern  . None   Social History Narrative  . None    Menstrual cycle:   LMP: No LMP recorded. Patient is not currently having periods (Reason: Lactating).           Cycle: None, breastfeeding  The following portions of the patient's history were reviewed and updated as appropriate: allergies, current medications, past family history, past medical history, past social history, past surgical history and problem list.  Review of Systems Pertinent items are noted in HPI. Breast:Negative for breast lump,nipple discharge or nipple retraction Gastrointestinal: Negative for abdominal pain, change in bowel habits or rectal bleeding Urinary:negative   Objective:    BP 102/58  Temp 98 F (36.7 C) (Oral)  Ht 5\' 9"  (1.753 m)  Wt 128 lb (58.06 kg)  BMI 18.90 kg/m2  Breastfeeding? Yes    Weight:  Wt Readings from Last 1 Encounters:  02/21/12 128 lb (58.06 kg)      BMI: Body mass index is 18.90 kg/(m^2).  General Appearance: Alert, appropriate appearance for age. No acute distress HEENT: Grossly normal Neck / Thyroid: Supple, no masses, nodes or enlargement Lungs: clear to auscultation bilaterally Back: No CVA tenderness Breast Exam: No dimpling, nipple retraction or discharge. No masses or nodes on right.  Left breast with erythema from 10 o'clock to 12 o'clock, no masses or tenderness Cardiovascular: Regular rate and rhythm. S1, S2, no murmur Gastrointestinal: Soft, non-tender, no masses or organomegaly.  Incision well healed Pelvic Exam: External genitalia: normal general appearance Vaginal: atrophic mucosa Cervix: normal appearance Adnexa: no masses Uterus: normal single, nontender Rectovaginal: normal rectal, no masses Lymphatic Exam: Non-palpable nodes in neck, clavicular, axillary, or inguinal regions Skin: no rash or abnormalities Neurologic: Normal gait and speech, no tremor  Psychiatric: Alert and oriented, appropriate affect.   Wet Prep:not applicable Urinalysis:not applicable UPT: Not done   Assessment:    Early mastitis, pt declines antibiotics unless absolutely necessary    Plan:   Culture of breast milk.  If temp> or = 101, pt to call for antibiotics Mammogram due 6 months after weaning pap smear due 2015 return annually or prn STD screening: declined Contraception:no method      Dierdre Forth, MD

## 2012-02-24 LAB — WOUND CULTURE
Gram Stain: NONE SEEN
Gram Stain: NONE SEEN
Organism ID, Bacteria: NO GROWTH

## 2012-03-06 ENCOUNTER — Encounter: Payer: BC Managed Care – PPO | Admitting: Obstetrics and Gynecology

## 2012-11-03 ENCOUNTER — Other Ambulatory Visit: Payer: Self-pay

## 2012-11-03 DIAGNOSIS — Z1231 Encounter for screening mammogram for malignant neoplasm of breast: Secondary | ICD-10-CM

## 2013-04-06 ENCOUNTER — Ambulatory Visit
Admission: RE | Admit: 2013-04-06 | Discharge: 2013-04-06 | Disposition: A | Discharge status: Home / Self Care | Payer: BC Managed Care – PPO | Source: Ambulatory Visit

## 2013-04-06 DIAGNOSIS — Z1231 Encounter for screening mammogram for malignant neoplasm of breast: Secondary | ICD-10-CM

## 2013-07-02 HISTORY — PX: ABDOMINOPLASTY: SUR9

## 2013-08-19 ENCOUNTER — Encounter (INDEPENDENT_AMBULATORY_CARE_PROVIDER_SITE_OTHER): Payer: Self-pay | Admitting: Surgery

## 2013-08-19 ENCOUNTER — Ambulatory Visit (INDEPENDENT_AMBULATORY_CARE_PROVIDER_SITE_OTHER): Payer: BC Managed Care – PPO | Admitting: Surgery

## 2013-08-19 VITALS — BP 100/60 | HR 76 | Temp 98.6°F | Resp 14 | Ht 69.0 in | Wt 130.0 lb

## 2013-08-19 DIAGNOSIS — K429 Umbilical hernia without obstruction or gangrene: Secondary | ICD-10-CM

## 2013-08-19 NOTE — Patient Instructions (Signed)
[facility]  HERNIA REPAIR POST OP INSTRUCTIONS  Always review your discharge instruction sheet given to you by the facility where your surgery was performed.  1. A  prescription for pain medication may be given to you upon discharge.  Take your pain medication as prescribed.  If narcotic pain medicine is not needed, then you may take acetaminophen (Tylenol) or ibuprofen (Advil) as needed.  2. Take your usually prescribed medications unless otherwise directed.  3. If you need a refill on your pain medication, please contact your pharmacy.  They will contact our office to request authorization. Prescriptions will not be filled after 5 pm daily or on weekends.  4. You should follow a light diet the first 24 hours after arrival home, such as soup and crackers or toast.  Be sure to include plenty of fluids daily.  Resume your normal diet the day after surgery.  5. Most patients will experience some swelling and bruising around the surgical site.  Ice packs and reclining will help.  Swelling and bruising can take several days to resolve.   6. It is common to experience some constipation if taking pain medication after surgery.  Increasing fluid intake and taking a stool softener (such as Colace) will usually help or prevent this problem from occurring.  A mild laxative (Milk of Magnesia or Miralax) should be taken according to package directions if there are no bowel movements after 48 hours.  7. Unless discharge instructions indicate otherwise, you may remove your bandages 24-48 hours after surgery, and you may shower at that time.  You may have steri-strips (small skin tapes) in place directly over the incision.  These strips should be left on the skin for 7-10 days.  If your surgeon used skin glue on the incision, you may shower in 24 hours.  The glue will flake off over the next 2-3 weeks.  Any sutures or staples will be removed at the office during your follow-up  visit.  8. ACTIVITIES:  You may resume regular (light) daily activities beginning the next day-such as daily self-care, walking, climbing stairs-gradually increasing activities as tolerated.  You may have sexual intercourse when it is comfortable.  Refrain from any heavy lifting or straining until approved by your doctor.  You may drive when you are no longer taking prescription pain medication, you can comfortably wear a seatbelt, and you can safely maneuver your car and apply brakes.  9. You should see your doctor in the office for a follow-up appointment approximately 2-3 weeks after your surgery.  Make sure that you call for this appointment within a day or two after you arrive home to insure a convenient appointment time. 10.   WHEN TO CALL YOUR DOCTOR: 1. Fever greater than 101.0 2. Inability to urinate 3. Persistent nausea and/or vomiting 4. Extreme swelling or bruising 5. Continued bleeding from incision 6. Increased pain, redness, or drainage from the incision  The clinic staff is available to answer your questions during regular business hours.  Please don't hesitate to call and ask to speak to one of the nurses for clinical concerns.  If you have a medical emergency, go to the nearest emergency room or call 911.  A surgeon from [facility] is always on call for the hospital.   [facility] [address], [address], [place], [place]  [address]  [phone] ? [phone] ? FAX [phone]  [email or web address]   

## 2013-08-19 NOTE — Progress Notes (Signed)
General Surgery [facility]  Chief Complaint  Patient presents with  . New Evaluation    eval umbilical hernia - referral from Dr. Sallye Lat    HISTORY: Patient is a 43 year old female referred by her plastic surgeon for evaluation of umbilical hernia. Patient has had 2 children, 10 years ago and 22 months ago. She has developed a moderate to significant rectus diastases. Despite an active exercise routine, she has been unable to reduce her torso significantly. She was evaluated by plastic surgery and is planning to undergo abdominoplasty in the near future. At the time of her examination she was noted to have a small reducible umbilical hernia. She presents today for evaluation for concurrent repair.  Past surgical history includes cesarean section for both pregnancies. No other abdominal surgery. No prior hernia repairs.  Past Medical History  Diagnosis Date  . Premature ovarian failure   . Cancer     Melanoma  . Infection     HX OF OCCASIONAL UTI  . History of chicken pox   . Yeast infection   . Ovarian cyst   . Breast cyst   . Infertility, female   . Fibrocystic breast     Current Outpatient Prescriptions  Medication Sig Dispense Refill  . JUNEL 1/20 1-20 MG-MCG tablet       . Probiotic Product (PROBIOTIC FORMULA PO) Take by mouth.       No current facility-administered medications for this visit.    No Known Allergies  Family History  Problem Relation Age of Onset  . COPD Mother   . Mental illness Mother     DEPRESSION AND SUICIDE  . Migraines Mother   . Hypertension Father   . Cancer Father     KIDNEY AND PROSTATE  . Anemia Sister   . Autism Son   . Cancer Maternal Uncle     LUNG  . Rheum arthritis Maternal Grandmother   . Diabetes Maternal Grandfather     TYPE I  . Cancer Paternal Grandfather     colon    History   Social History  . Marital Status: Married    Spouse Name: N/A    Number of Children: N/A  . Years of  Education: N/A   Social History Main Topics  . Smoking status: Never Smoker   . Smokeless tobacco: Never Used  . Alcohol Use: 0.5 oz/week    1 drink(s) per week     Comment: week  . Drug Use: No  . Sexual Activity: Not Currently    Birth Control/ Protection: None   Other Topics Concern  . None   Social History Narrative  . None    REVIEW OF SYSTEMS - PERTINENT POSITIVES ONLY: Denies pain. Denies signs or symptoms of obstruction. No prior hernia repairs.  EXAM: Filed Vitals:   08/19/13 0853  BP: 100/60  Pulse: 76  Temp: 98.6 F (37 C)  Resp: 14    GENERAL: well-developed, well-nourished, no acute distress HEENT: normocephalic; pupils equal and reactive; sclerae clear; dentition good; mucous membranes moist NECK:  symmetric on extension; no palpable anterior or posterior cervical lymphadenopathy; no supraclavicular masses; no tenderness CHEST: clear to auscultation bilaterally without rales, rhonchi, or wheezes CARDIAC: regular rate and rhythm without significant murmur; peripheral pulses are full ABDOMEN: soft without distension; bowel sounds present; no mass; no hepatosplenomegaly; moderate rectus diastases with approximately 4-5 cm of separation; small reducible umbilical hernia with fascial defect measuring approximately 5 mm EXT:  non-tender without edema;  no deformity NEURO: no gross focal deficits; no sign of tremor   LABORATORY RESULTS: See [facility] (CHL-Epic) for most recent results  RADIOLOGY RESULTS: See [facility] (CHL-Epic) for most recent results  IMPRESSION: #1 reducible umbilical hernia, asymptomatic #2 moderate rectus diastases  PLAN: I discussed the above findings with the patient at length. We discussed primary repair of umbilical hernia at the time of abdominoplasty. We discussed the risk of ischemia to the umbilical skin. We discussed the possibility of recurrent hernia in need for repair at a later date by laparoscopic technique.  She understands and wishes to proceed. We will make arrangements for surgery in conjunction with Dr. Sallye Lat at a time convenient for the patient in the near future.  The risks and benefits of the procedure have been discussed at length with the patient.  The patient understands the proposed procedure, potential alternative treatments, and the course of recovery to be expected.  All of the patient's questions have been answered at this time.  The patient wishes to proceed with surgery.  Earnstine Regal, MD, [facility]  Primary Care Physician: Gerrit Heck, MD

## 2013-10-07 DIAGNOSIS — K429 Umbilical hernia without obstruction or gangrene: Secondary | ICD-10-CM

## 2013-10-09 ENCOUNTER — Telehealth (INDEPENDENT_AMBULATORY_CARE_PROVIDER_SITE_OTHER): Payer: Self-pay

## 2013-10-09 NOTE — Telephone Encounter (Signed)
Pt scheduled for post op 10/19/13 with Dr. Harlow Asa.  She was advised to call Dr. Stephanie Coup for any changes to her pain medication.

## 2013-10-20 ENCOUNTER — Encounter (INDEPENDENT_AMBULATORY_CARE_PROVIDER_SITE_OTHER): Payer: Self-pay | Admitting: Surgery

## 2013-10-20 ENCOUNTER — Encounter (INDEPENDENT_AMBULATORY_CARE_PROVIDER_SITE_OTHER): Payer: Self-pay

## 2013-10-20 ENCOUNTER — Ambulatory Visit (INDEPENDENT_AMBULATORY_CARE_PROVIDER_SITE_OTHER): Payer: BC Managed Care – PPO | Admitting: Surgery

## 2013-10-20 VITALS — BP 124/74 | HR 73 | Temp 98.0°F | Ht 69.0 in | Wt 126.8 lb

## 2013-10-20 DIAGNOSIS — K429 Umbilical hernia without obstruction or gangrene: Secondary | ICD-10-CM

## 2013-10-20 NOTE — Patient Instructions (Signed)
  CARE OF INCISION   Apply cocoa butter/vitamin E cream (Palmer's brand) to your incision 2 - 3 times daily.  Massage cream into incision for one minute with each application.  Use sunscreen (50 SPF or higher) for first 6 months after surgery if area is exposed to sun.  You may alternate Mederma or other scar reducing cream with cocoa butter cream if desired.       Mahkayla Preece M. Paitlyn Mcclatchey, MD, FACS      [facility]      Office: [phone]    

## 2013-10-20 NOTE — Progress Notes (Signed)
General Surgery [facility]  Chief Complaint  Patient presents with  . Routine Post Op    primary hernia repair on 10/07/2013    HISTORY: Patient is a 43 year old female who underwent abdominoplasty by Dr. Stephanie Coup. I performed a concurrent primary repair of umbilical hernia. She returns today for postoperative wound check.  EXAM: Surgical incisions are healing nicely. No sign of infection. Umbilical skin is viable. There is a dry eschar along the cephalad portion of the circumferential incision. There is no fluctuance. There is no drainage. With Valsalva there is no sign of recurrent hernia.  IMPRESSION: Status post primary repair umbilical hernia concurrent with abdominoplasty  PLAN: Patient will apply topical creams to her incisions once this is approved by Dr. Stephanie Coup. Her activity is restricted for several more weeks.  Patient will return for surgical care as needed.  Earnstine Regal, MD, [facility]   Visit Diagnoses: 1. Umbilical hernia

## 2014-02-26 ENCOUNTER — Encounter: Payer: Self-pay | Admitting: General Surgery

## 2014-02-26 DIAGNOSIS — R42 Dizziness and giddiness: Secondary | ICD-10-CM | POA: Insufficient documentation

## 2014-02-26 DIAGNOSIS — R002 Palpitations: Secondary | ICD-10-CM | POA: Insufficient documentation

## 2014-03-15 ENCOUNTER — Other Ambulatory Visit: Payer: Self-pay

## 2014-03-15 DIAGNOSIS — Z1231 Encounter for screening mammogram for malignant neoplasm of breast: Secondary | ICD-10-CM

## 2014-03-19 ENCOUNTER — Ambulatory Visit: Payer: BC Managed Care – PPO | Admitting: Cardiology

## 2014-04-07 ENCOUNTER — Ambulatory Visit
Admission: RE | Admit: 2014-04-07 | Discharge: 2014-04-07 | Disposition: A | Discharge status: Home / Self Care | Payer: BC Managed Care – PPO | Source: Ambulatory Visit

## 2014-04-07 DIAGNOSIS — Z1231 Encounter for screening mammogram for malignant neoplasm of breast: Secondary | ICD-10-CM

## 2014-04-09 ENCOUNTER — Other Ambulatory Visit: Payer: Self-pay | Admitting: Obstetrics and Gynecology

## 2014-04-09 DIAGNOSIS — N644 Mastodynia: Secondary | ICD-10-CM

## 2014-04-16 ENCOUNTER — Ambulatory Visit
Admission: RE | Admit: 2014-04-16 | Discharge: 2014-04-16 | Disposition: A | Discharge status: Home / Self Care | Payer: BC Managed Care – PPO | Source: Ambulatory Visit | Attending: Obstetrics and Gynecology | Admitting: Obstetrics and Gynecology

## 2014-04-16 DIAGNOSIS — N644 Mastodynia: Secondary | ICD-10-CM

## 2014-05-03 ENCOUNTER — Encounter: Payer: Self-pay | Admitting: General Surgery

## 2015-07-28 ENCOUNTER — Other Ambulatory Visit: Payer: Self-pay | Admitting: Obstetrics and Gynecology

## 2015-10-17 DIAGNOSIS — F419 Anxiety disorder, unspecified: Secondary | ICD-10-CM | POA: Diagnosis not present

## 2015-10-17 DIAGNOSIS — F331 Major depressive disorder, recurrent, moderate: Secondary | ICD-10-CM | POA: Diagnosis not present

## 2015-10-28 DIAGNOSIS — N951 Menopausal and female climacteric states: Secondary | ICD-10-CM | POA: Diagnosis not present

## 2016-01-04 DIAGNOSIS — R1032 Left lower quadrant pain: Secondary | ICD-10-CM | POA: Diagnosis not present

## 2016-01-04 DIAGNOSIS — R5381 Other malaise: Secondary | ICD-10-CM | POA: Diagnosis not present

## 2016-01-04 DIAGNOSIS — E288 Other ovarian dysfunction: Secondary | ICD-10-CM | POA: Diagnosis not present

## 2016-01-04 DIAGNOSIS — E7212 Methylenetetrahydrofolate reductase deficiency: Secondary | ICD-10-CM | POA: Diagnosis not present

## 2016-01-05 DIAGNOSIS — G44209 Tension-type headache, unspecified, not intractable: Secondary | ICD-10-CM | POA: Diagnosis not present

## 2016-01-23 DIAGNOSIS — H6120 Impacted cerumen, unspecified ear: Secondary | ICD-10-CM | POA: Diagnosis not present

## 2016-01-23 DIAGNOSIS — F411 Generalized anxiety disorder: Secondary | ICD-10-CM | POA: Diagnosis not present

## 2016-03-09 DIAGNOSIS — Z808 Family history of malignant neoplasm of other organs or systems: Secondary | ICD-10-CM | POA: Diagnosis not present

## 2016-03-09 DIAGNOSIS — D485 Neoplasm of uncertain behavior of skin: Secondary | ICD-10-CM | POA: Diagnosis not present

## 2016-03-09 DIAGNOSIS — D225 Melanocytic nevi of trunk: Secondary | ICD-10-CM | POA: Diagnosis not present

## 2016-03-09 DIAGNOSIS — Z86018 Personal history of other benign neoplasm: Secondary | ICD-10-CM | POA: Diagnosis not present

## 2016-04-09 DIAGNOSIS — R5381 Other malaise: Secondary | ICD-10-CM | POA: Diagnosis not present

## 2016-04-09 DIAGNOSIS — N951 Menopausal and female climacteric states: Secondary | ICD-10-CM | POA: Diagnosis not present

## 2016-04-09 DIAGNOSIS — E039 Hypothyroidism, unspecified: Secondary | ICD-10-CM | POA: Diagnosis not present

## 2016-04-09 DIAGNOSIS — E559 Vitamin D deficiency, unspecified: Secondary | ICD-10-CM | POA: Diagnosis not present

## 2016-04-24 DIAGNOSIS — Z01419 Encounter for gynecological examination (general) (routine) without abnormal findings: Secondary | ICD-10-CM | POA: Diagnosis not present

## 2016-04-24 DIAGNOSIS — Z1231 Encounter for screening mammogram for malignant neoplasm of breast: Secondary | ICD-10-CM | POA: Diagnosis not present

## 2016-04-24 DIAGNOSIS — E2839 Other primary ovarian failure: Secondary | ICD-10-CM | POA: Diagnosis not present

## 2016-04-24 DIAGNOSIS — Z1321 Encounter for screening for nutritional disorder: Secondary | ICD-10-CM | POA: Diagnosis not present

## 2016-04-24 DIAGNOSIS — N951 Menopausal and female climacteric states: Secondary | ICD-10-CM | POA: Diagnosis not present

## 2016-05-01 DIAGNOSIS — F438 Other reactions to severe stress: Secondary | ICD-10-CM | POA: Diagnosis not present

## 2016-05-01 DIAGNOSIS — R5383 Other fatigue: Secondary | ICD-10-CM | POA: Diagnosis not present

## 2016-05-01 DIAGNOSIS — E7212 Methylenetetrahydrofolate reductase deficiency: Secondary | ICD-10-CM | POA: Diagnosis not present

## 2016-05-09 DIAGNOSIS — N9489 Other specified conditions associated with female genital organs and menstrual cycle: Secondary | ICD-10-CM | POA: Diagnosis not present

## 2016-05-09 DIAGNOSIS — R102 Pelvic and perineal pain: Secondary | ICD-10-CM | POA: Diagnosis not present

## 2016-05-28 DIAGNOSIS — F411 Generalized anxiety disorder: Secondary | ICD-10-CM | POA: Diagnosis not present

## 2016-05-31 DIAGNOSIS — N9489 Other specified conditions associated with female genital organs and menstrual cycle: Secondary | ICD-10-CM | POA: Diagnosis not present

## 2016-05-31 DIAGNOSIS — R51 Headache: Secondary | ICD-10-CM | POA: Diagnosis not present

## 2016-05-31 DIAGNOSIS — N951 Menopausal and female climacteric states: Secondary | ICD-10-CM | POA: Diagnosis not present

## 2016-06-08 DIAGNOSIS — I788 Other diseases of capillaries: Secondary | ICD-10-CM | POA: Diagnosis not present

## 2016-06-26 DIAGNOSIS — R109 Unspecified abdominal pain: Secondary | ICD-10-CM | POA: Diagnosis not present

## 2016-07-10 DIAGNOSIS — K625 Hemorrhage of anus and rectum: Secondary | ICD-10-CM | POA: Diagnosis not present

## 2016-07-10 DIAGNOSIS — R194 Change in bowel habit: Secondary | ICD-10-CM | POA: Diagnosis not present

## 2016-07-10 DIAGNOSIS — R1032 Left lower quadrant pain: Secondary | ICD-10-CM | POA: Diagnosis not present

## 2016-07-10 DIAGNOSIS — K5904 Chronic idiopathic constipation: Secondary | ICD-10-CM | POA: Diagnosis not present

## 2016-08-01 DIAGNOSIS — Z1211 Encounter for screening for malignant neoplasm of colon: Secondary | ICD-10-CM | POA: Diagnosis not present

## 2016-08-01 DIAGNOSIS — R194 Change in bowel habit: Secondary | ICD-10-CM | POA: Diagnosis not present

## 2016-08-01 DIAGNOSIS — D128 Benign neoplasm of rectum: Secondary | ICD-10-CM | POA: Diagnosis not present

## 2016-08-01 DIAGNOSIS — Z800 Family history of malignant neoplasm of digestive organs: Secondary | ICD-10-CM | POA: Diagnosis not present

## 2016-08-01 DIAGNOSIS — K635 Polyp of colon: Secondary | ICD-10-CM | POA: Diagnosis not present

## 2016-08-01 DIAGNOSIS — K621 Rectal polyp: Secondary | ICD-10-CM | POA: Diagnosis not present

## 2016-08-30 DIAGNOSIS — L570 Actinic keratosis: Secondary | ICD-10-CM | POA: Diagnosis not present

## 2016-08-30 DIAGNOSIS — L821 Other seborrheic keratosis: Secondary | ICD-10-CM | POA: Diagnosis not present

## 2016-09-13 DIAGNOSIS — K5730 Diverticulosis of large intestine without perforation or abscess without bleeding: Secondary | ICD-10-CM | POA: Diagnosis not present

## 2016-09-13 DIAGNOSIS — K5904 Chronic idiopathic constipation: Secondary | ICD-10-CM | POA: Diagnosis not present

## 2016-09-13 DIAGNOSIS — R140 Abdominal distension (gaseous): Secondary | ICD-10-CM | POA: Diagnosis not present

## 2016-10-10 DIAGNOSIS — H5213 Myopia, bilateral: Secondary | ICD-10-CM | POA: Diagnosis not present

## 2016-10-10 DIAGNOSIS — E2749 Other adrenocortical insufficiency: Secondary | ICD-10-CM | POA: Diagnosis not present

## 2016-10-10 DIAGNOSIS — E7212 Methylenetetrahydrofolate reductase deficiency: Secondary | ICD-10-CM | POA: Diagnosis not present

## 2016-10-10 DIAGNOSIS — R5381 Other malaise: Secondary | ICD-10-CM | POA: Diagnosis not present

## 2016-10-10 DIAGNOSIS — E559 Vitamin D deficiency, unspecified: Secondary | ICD-10-CM | POA: Diagnosis not present

## 2016-10-10 DIAGNOSIS — N951 Menopausal and female climacteric states: Secondary | ICD-10-CM | POA: Diagnosis not present

## 2016-10-10 DIAGNOSIS — E039 Hypothyroidism, unspecified: Secondary | ICD-10-CM | POA: Diagnosis not present

## 2016-10-10 DIAGNOSIS — R6882 Decreased libido: Secondary | ICD-10-CM | POA: Diagnosis not present

## 2016-11-02 DIAGNOSIS — E288 Other ovarian dysfunction: Secondary | ICD-10-CM | POA: Diagnosis not present

## 2016-11-02 DIAGNOSIS — F438 Other reactions to severe stress: Secondary | ICD-10-CM | POA: Diagnosis not present

## 2016-11-02 DIAGNOSIS — E039 Hypothyroidism, unspecified: Secondary | ICD-10-CM | POA: Diagnosis not present

## 2017-02-21 DIAGNOSIS — R51 Headache: Secondary | ICD-10-CM | POA: Diagnosis not present

## 2017-03-19 DIAGNOSIS — Z86008 Personal history of in-situ neoplasm of other site: Secondary | ICD-10-CM | POA: Diagnosis not present

## 2017-03-19 DIAGNOSIS — Z808 Family history of malignant neoplasm of other organs or systems: Secondary | ICD-10-CM | POA: Diagnosis not present

## 2017-03-19 DIAGNOSIS — Z86018 Personal history of other benign neoplasm: Secondary | ICD-10-CM | POA: Diagnosis not present

## 2017-03-19 DIAGNOSIS — L570 Actinic keratosis: Secondary | ICD-10-CM | POA: Diagnosis not present

## 2017-03-19 DIAGNOSIS — D225 Melanocytic nevi of trunk: Secondary | ICD-10-CM | POA: Diagnosis not present

## 2017-03-25 DIAGNOSIS — N898 Other specified noninflammatory disorders of vagina: Secondary | ICD-10-CM | POA: Diagnosis not present

## 2017-04-16 ENCOUNTER — Ambulatory Visit: Payer: BLUE CROSS/BLUE SHIELD | Admitting: Neurology

## 2017-04-20 DIAGNOSIS — F438 Other reactions to severe stress: Secondary | ICD-10-CM | POA: Diagnosis not present

## 2017-04-20 DIAGNOSIS — N951 Menopausal and female climacteric states: Secondary | ICD-10-CM | POA: Diagnosis not present

## 2017-04-20 DIAGNOSIS — E039 Hypothyroidism, unspecified: Secondary | ICD-10-CM | POA: Diagnosis not present

## 2017-04-20 DIAGNOSIS — E282 Polycystic ovarian syndrome: Secondary | ICD-10-CM | POA: Diagnosis not present

## 2017-05-01 DIAGNOSIS — Z1231 Encounter for screening mammogram for malignant neoplasm of breast: Secondary | ICD-10-CM | POA: Diagnosis not present

## 2017-05-01 DIAGNOSIS — Z01419 Encounter for gynecological examination (general) (routine) without abnormal findings: Secondary | ICD-10-CM | POA: Diagnosis not present

## 2017-05-01 DIAGNOSIS — N951 Menopausal and female climacteric states: Secondary | ICD-10-CM | POA: Diagnosis not present

## 2017-05-13 DIAGNOSIS — E039 Hypothyroidism, unspecified: Secondary | ICD-10-CM | POA: Diagnosis not present

## 2017-05-13 DIAGNOSIS — R51 Headache: Secondary | ICD-10-CM | POA: Diagnosis not present

## 2017-05-13 DIAGNOSIS — E288 Other ovarian dysfunction: Secondary | ICD-10-CM | POA: Diagnosis not present

## 2017-06-11 ENCOUNTER — Telehealth: Payer: Self-pay | Admitting: *Deleted

## 2017-06-11 NOTE — Telephone Encounter (Signed)
Called patient regarding her appt for tomorrow 12/12. I did not leave any details on home message but stated I would attempt cell phone and then call again if no answer. I will be informing patient that the office is having a delayed opening tomorrow 12/12 and we will have to reschedule her appt that was scheduled for 8:45.  Attempted mobile number, left non-detailed voicemail. Will call back again today.

## 2017-06-11 NOTE — Telephone Encounter (Signed)
Called patient and spoke with her regarding her appt tomorrow 12/12. I informed her that due to the snow, we will be opening late and will have to call her back ASAP to get her rescheduled. She asked not to have to wait another month for reschedule as she has been having horrible headaches. I apologized for inconvenience and informed her that we would get her in as soon as we could. She verbalized understanding and appreciation and is aware that if any emergencies arise with headaches in the meantime to go to ED or urgent care.

## 2017-06-12 ENCOUNTER — Ambulatory Visit: Payer: BLUE CROSS/BLUE SHIELD | Admitting: Neurology

## 2017-06-12 ENCOUNTER — Encounter (INDEPENDENT_AMBULATORY_CARE_PROVIDER_SITE_OTHER): Payer: Self-pay

## 2017-06-12 ENCOUNTER — Encounter: Payer: Self-pay | Admitting: Neurology

## 2017-06-12 VITALS — BP 103/52 | HR 72 | Ht 69.0 in | Wt 137.6 lb

## 2017-06-12 DIAGNOSIS — G43019 Migraine without aura, intractable, without status migrainosus: Secondary | ICD-10-CM

## 2017-06-12 DIAGNOSIS — G43009 Migraine without aura, not intractable, without status migrainosus: Secondary | ICD-10-CM | POA: Diagnosis not present

## 2017-06-12 DIAGNOSIS — R51 Headache with orthostatic component, not elsewhere classified: Secondary | ICD-10-CM

## 2017-06-12 DIAGNOSIS — R519 Headache, unspecified: Secondary | ICD-10-CM

## 2017-06-12 MED ORDER — RIZATRIPTAN BENZOATE 10 MG PO TABS: 10.0000 mg | ORAL_TABLET | ORAL | 11 refills | Status: DC | PRN

## 2017-06-12 MED ORDER — NORTRIPTYLINE HCL 10 MG PO CAPS: 10.0000 mg | ORAL_CAPSULE | Freq: Every day | ORAL | 11 refills | Status: DC

## 2017-06-12 MED ORDER — METHYLPREDNISOLONE 4 MG PO TBPK: ORAL_TABLET | ORAL | 1 refills | Status: DC

## 2017-06-12 NOTE — Patient Instructions (Addendum)
Magnesium citrate 400mg  to 600mg  daily, riboflavin 400mg  daily, Coenzyme Q 10 100mg  three times daily Consider joining Facebook group [facility].   Start Nortriptyline 10mg  at bedtime Medrol dosepak for 6 days Maxalt(Rizatriptan): Please take one tablet at the onset of your headache. If it does not improve the symptoms please take one additional tablet. Do not take more then 2 tablets in 24hrs. Do not take use more then 2 to 3 times in a week.   MRI brain w/wo contrast   Ajovy "CGRP Migraine"   Nortriptyline capsules What is this medicine? NORTRIPTYLINE (nor TRIP ti leen) is used to treat depression, migraines This medicine may be used for other purposes; ask your health care provider or pharmacist if you have questions. COMMON BRAND NAME(S): Aventyl, Pamelor What should I tell my health care provider before I take this medicine? They need to know if you have any of these conditions: -an alcohol problem -bipolar disorder or schizophrenia -difficulty passing urine, prostate trouble -glaucoma -heart disease or recent heart attack -liver disease -over active thyroid -seizures -thoughts or plans of suicide or a previous suicide attempt or family history of suicide attempt -an unusual or allergic reaction to nortriptyline, other medicines, foods, dyes, or preservatives -pregnant or trying to get pregnant -breast-feeding How should I use this medicine? Take this medicine by mouth with a glass of water. Follow the directions on the prescription label. Take your doses at regular intervals. Do not take it more often than directed. Do not stop taking this medicine suddenly except upon the advice of your doctor. Stopping this medicine too quickly may cause serious side effects or your condition may worsen. A special MedGuide will be given to you by the pharmacist with each prescription and refill. Be sure to read this information carefully each time. Talk to your  pediatrician regarding the use of this medicine in children. Special care may be needed. Overdosage: If you think you have taken too much of this medicine contact a poison control center or emergency room at once. NOTE: This medicine is only for you. Do not share this medicine with others. What if I miss a dose? If you miss a dose, take it as soon as you can. If it is almost time for your next dose, take only that dose. Do not take double or extra doses. What may interact with this medicine? Do not take this medicine with any of the following medications: -arsenic trioxide -certain medicines medicines for irregular heart beat -cisapride -halofantrine -linezolid -MAOIs like Carbex, Eldepryl, Marplan, Nardil, and Parnate -methylene blue (injected into a vein) -other medicines for mental depression -phenothiazines like perphenazine, thioridazine and chlorpromazine -pimozide -probucol -procarbazine -sparfloxacin -St. John's Wort -ziprasidone This medicine may also interact with any of the following medications: -atropine and related drugs like hyoscyamine, scopolamine, tolterodine and others -barbiturate medicines for inducing sleep or treating seizures, such as phenobarbital -cimetidine -medicines for diabetes -medicines for seizures like carbamazepine or phenytoin -reserpine -thyroid medicine This list may not describe all possible interactions. Give your health care provider a list of all the medicines, herbs, non-prescription drugs, or dietary supplements you use. Also tell them if you smoke, drink alcohol, or use illegal drugs. Some items may interact with your medicine. What should I watch for while using this medicine? Tell your doctor if your symptoms do not get better or if they get worse. Visit your doctor or health care professional for regular checks on your progress. Because it may take several weeks  to see the full effects of this medicine, it is important to continue your  treatment as prescribed by your doctor. Patients and their families should watch out for new or worsening thoughts of suicide or depression. Also watch out for sudden changes in feelings such as feeling anxious, agitated, panicky, irritable, hostile, aggressive, impulsive, severely restless, overly excited and hyperactive, or not being able to sleep. If this happens, especially at the beginning of treatment or after a change in dose, call your health care professional. Dennis Bast may get drowsy or dizzy. Do not drive, use machinery, or do anything that needs mental alertness until you know how this medicine affects you. Do not stand or sit up quickly, especially if you are an older patient. This reduces the risk of dizzy or fainting spells. Alcohol may interfere with the effect of this medicine. Avoid alcoholic drinks. Do not treat yourself for coughs, colds, or allergies without asking your doctor or health care professional for advice. Some ingredients can increase possible side effects. Your mouth may get dry. Chewing sugarless gum or sucking hard candy, and drinking plenty of water may help. Contact your doctor if the problem does not go away or is severe. This medicine may cause dry eyes and blurred vision. If you wear contact lenses you may feel some discomfort. Lubricating drops may help. See your eye doctor if the problem does not go away or is severe. This medicine can cause constipation. Try to have a bowel movement at least every 2 to 3 days. If you do not have a bowel movement for 3 days, call your doctor or health care professional. This medicine can make you more sensitive to the sun. Keep out of the sun. If you cannot avoid being in the sun, wear protective clothing and use sunscreen. Do not use sun lamps or tanning beds/booths. What side effects may I notice from receiving this medicine? Side effects that you should report to your doctor or health care professional as soon as possible: -allergic  reactions like skin rash, itching or hives, swelling of the face, lips, or tongue -anxious -breathing problems -changes in vision -confusion -elevated mood, decreased need for sleep, racing thoughts, impulsive behavior -eye pain -fast, irregular heartbeat -feeling faint or lightheaded, falls -feeling agitated, angry, or irritable -fever with increased sweating -hallucination, loss of contact with reality -seizures -stiff muscles -suicidal thoughts or other mood changes -tingling, pain, or numbness in the feet or hands -trouble passing urine or change in the amount of urine -trouble sleeping -unusually weak or tired -vomiting -yellowing of the eyes or skin Side effects that usually do not require medical attention (report to your doctor or health care professional if they continue or are bothersome): -change in sex drive or performance -change in appetite or weight -constipation -dizziness -dry mouth -nausea -tired -tremors -upset stomach This list may not describe all possible side effects. Call your doctor for medical advice about side effects. You may report side effects to FDA at 1-800-FDA-1088. Where should I keep my medicine? Keep out of the reach of children. Store at room temperature between 15 and 30 degrees C (59 and 86 degrees F). Keep container tightly closed. Throw away any unused medicine after the expiration date. NOTE: This sheet is a summary. It may not cover all possible information. If you have questions about this medicine, talk to your doctor, pharmacist, or health care provider.  2018 Elsevier/Gold Standard (2015-11-18 12:53:08)  Rizatriptan tablets What is this medicine? RIZATRIPTAN (rye za TRIP tan)  is used to treat migraines with or without aura. An aura is a strange feeling or visual disturbance that warns you of an attack. It is not used to prevent migraines. This medicine may be used for other purposes; ask your health care provider or pharmacist  if you have questions. COMMON BRAND NAME(S): Maxalt What should I tell my health care provider before I take this medicine? They need to know if you have any of these conditions: -bowel disease or colitis -diabetes -family history of heart disease -fast or irregular heart beat -heart or blood vessel disease, angina (chest pain), or previous heart attack -high blood pressure -high cholesterol -history of stroke, transient ischemic attacks (TIAs or mini-strokes), or intracranial bleeding -kidney or liver disease -overweight -poor circulation -postmenopausal or surgical removal of uterus and ovaries -Raynaud's disease -seizure disorder -an unusual or allergic reaction to rizatriptan, other medicines, foods, dyes, or preservatives -pregnant or trying to get pregnant -breast-feeding How should I use this medicine? This medicine is taken by mouth with a glass of water. Follow the directions on the prescription label. This medicine is taken at the first symptoms of a migraine. It is not for everyday use. If your migraine headache returns after one dose, you can take another dose as directed. You must leave at least 2 hours between doses, and do not take more than 30 mg total in 24 hours. If there is no improvement at all after the first dose, do not take a second dose without talking to your doctor or health care professional. Do not take your medicine more often than directed. Talk to your pediatrician regarding the use of this medicine in children. While this drug may be prescribed for children as young as 6 years for selected conditions, precautions do apply. Overdosage: If you think you have taken too much of this medicine contact a poison control center or emergency room at once. NOTE: This medicine is only for you. Do not share this medicine with others. What if I miss a dose? This does not apply; this medicine is not for regular use. What may interact with this medicine? Do not take this  medicine with any of the following medicines: -amphetamine, dextroamphetamine or cocaine -dihydroergotamine, ergotamine, ergoloid mesylates, methysergide, or ergot-type medication - do not take within 24 hours of taking rizatriptan -feverfew -MAOIs like Carbex, Eldepryl, Marplan, Nardil, and Parnate - do not take rizatriptan within 2 weeks of stopping MAOI therapy. -other migraine medicines like almotriptan, eletriptan, naratriptan, sumatriptan, zolmitriptan - do not take within 24 hours of taking rizatriptan -tryptophan This medicine may also interact with the following medications: -medicines for mental depression, anxiety or mood problems -propranolol This list may not describe all possible interactions. Give your health care provider a list of all the medicines, herbs, non-prescription drugs, or dietary supplements you use. Also tell them if you smoke, drink alcohol, or use illegal drugs. Some items may interact with your medicine. What should I watch for while using this medicine? Only take this medicine for a migraine headache. Take it if you get warning symptoms or at the start of a migraine attack. It is not for regular use to prevent migraine attacks. You may get drowsy or dizzy. Do not drive, use machinery, or do anything that needs mental alertness until you know how this medicine affects you. To reduce dizzy or fainting spells, do not sit or stand up quickly, especially if you are an older patient. Alcohol can increase drowsiness, dizziness and flushing. Avoid alcoholic  drinks. Smoking cigarettes may increase the risk of heart-related side effects from using this medicine. If you take migraine medicines for 10 or more days a month, your migraines may get worse. Keep a diary of headache days and medicine use. Contact your healthcare professional if your migraine attacks occur more frequently. What side effects may I notice from receiving this medicine? Side effects that you should report  to your doctor or health care professional as soon as possible: -allergic reactions like skin rash, itching or hives, swelling of the face, lips, or tongue -fast, slow, or irregular heart beat -increased or decreased blood pressure -seizures -severe stomach pain and cramping, bloody diarrhea -signs and symptoms of a blood clot such as breathing problems; changes in vision; chest pain; severe, sudden headache; pain, swelling, warmth in the leg; trouble speaking; sudden numbness or weakness of the face, arm or leg -tingling, pain, or numbness in the face, hands, or feet Side effects that usually do not require medical attention (report to your doctor or health care professional if they continue or are bothersome): -drowsiness -dry mouth -feeling warm, flushing, or redness of the face -headache -muscle cramps, pain -nausea, vomiting -unusually weak or tired This list may not describe all possible side effects. Call your doctor for medical advice about side effects. You may report side effects to FDA at 1-800-FDA-1088. Where should I keep my medicine? Keep out of the reach of children. Store at room temperature between 15 and 30 degrees C (59 and 86 degrees F). Keep container tightly closed. Throw away any unused medicine after the expiration date. NOTE: This sheet is a summary. It may not cover all possible information. If you have questions about this medicine, talk to your doctor, pharmacist, or health care provider.  2018 Elsevier/Gold Standard (2013-02-17 10:16:39)

## 2017-06-12 NOTE — Telephone Encounter (Signed)
See if she can come today thanks

## 2017-06-12 NOTE — Progress Notes (Signed)
[facility]    Provider:  Dr Jaynee Eagles Referring Provider: Leighton Ruff, MD Primary Care Physician:  Leighton Ruff, MD  CC:  Migraines  HPI:  Madeline Walker is a 46 y.o. female here as a referral from Dr. Drema Dallas for past medical history of benign positional vertigo, stress and anxiety, chest pain and palpitations, anxiety. She was started on Topiramate. New headache started 6 months ago. It is on the left side of her head. She also feels it in the back of her head. Usually over her left eye. Worse positionally when bending over for example, feels like pressure like her head was going to "blow", bending over would start the headache and last the whole day. Sometimes it is dull, sometimes it throbs so much she feels it in her neck, she stopped taking OTC meds, advil did not help. She cannot tolerate Topiramate. She can have related nausea, sound sensitivity - sound can worsen the headache. Movement makes it worse. No vomiting. Quiet dark rooms will help. Mother with migraines. She has daily headaches. All are migrainous. Severity varies, stress makes it worse. No other focal neurologic deficits, associated symptoms, inciting events or modifiable factors.  Medications tried: Topiramate, advil, citalopram,   Reviewed notes, labs and imaging from outside physicians, which showed:  TSH normal  Reviewed primary care notes.  This is a patient with 6 weeks of persistent left-sided headache worse on lying down at night, wakes with headaches persistent daily, over-the-counter medications such as NSAIDs not working.  No dizziness, no blurred vision, sometimes she has nausea, not aggravated by any foods, stress makes it worse, husband reports she is also snoring, left frontal headache daily.  Last year she was having a lot of pressure headaches.  No weakness no numbness.  Sleeps 8-9 hours.  No head injuries, no inciting events.  Headaches.  Review of Systems: Patient complains of  symptoms per HPI as well as the following symptoms: headache. Pertinent negatives and positives per HPI. All others negative.   Social History   Socioeconomic History  . Marital status: Married    Spouse name: Not on file  . Number of children: 2  . Years of education: 55  . Highest education level: Master's degree (e.g., MA, MS, MEng, MEd, MSW, MBA)  Social Needs  . Financial resource strain: Not on file  . Food insecurity - worry: Not on file  . Food insecurity - inability: Not on file  . Transportation needs - medical: Not on file  . Transportation needs - non-medical: Not on file  Occupational History  . Not on file  Tobacco Use  . Smoking status: Never Smoker  . Smokeless tobacco: Never Used  Substance and Sexual Activity  . Alcohol use: Yes    Alcohol/week: 0.5 oz    Types: 1 Standard drinks or equivalent per week    Comment: week  . Drug use: No  . Sexual activity: Not Currently    Comment: menopause  Other Topics Concern  . Not on file  Social History Narrative   Lives at home with husband and children   Right handed   1/2 cup coffee in the morning    Family History  Problem Relation Age of Onset  . COPD Mother   . Mental illness Mother        DEPRESSION AND SUICIDE  . Migraines Mother   . Hypertension Father   . Cancer Father        KIDNEY AND PROSTATE  . Anemia  Sister   . Autism Son   . Cancer Maternal Uncle        LUNG  . Rheum arthritis Maternal Grandmother   . Diabetes Maternal Grandfather        TYPE I  . Cancer Paternal Grandfather        colon    Past Medical History:  Diagnosis Date  . BPPV (benign paroxysmal positional vertigo)    [facility]  . Breast cyst   . Cancer ([place])    Melanoma  . Chest pain   . Fibrocystic breast   . History of chicken pox   . Infection    HX OF OCCASIONAL UTI  . Infertility, female   . Ovarian cyst   . Palpitations   . Premature ovarian failure   . Yeast infection     Past Surgical History:    Procedure Laterality Date  . ABDOMINOPLASTY  2015  . CESAREAN SECTION    . CESAREAN SECTION  10/31/2011   Procedure: CESAREAN SECTION;  Surgeon: Eldred Manges, MD;  Location: [facility];  Service: Gynecology;  Laterality: N/A;  . DILATION AND CURETTAGE OF UTERUS  2010   SAB  . RHINOPLASTY      Current Outpatient Medications  Medication Sig Dispense Refill  . citalopram (CELEXA) 20 MG tablet Take 20 mg by mouth daily.    . Estradiol (YUVAFEM) 10 MCG TABS vaginal tablet Place 1 tablet vaginally 2 (two) times a week. For 5 weeks    . Multiple Vitamin (MULTIVITAMIN) tablet Take 1 tablet by mouth daily.    Marland Kitchen PRESCRIPTION MEDICATION Place rectally every 4 (four) months. Testosterone PR inserted at GYN office    . PRESCRIPTION MEDICATION Place rectally every 4 (four) months. Estradiol PR inserted at GYN office    . Probiotic Product (PROBIOTIC FORMULA PO) Take by mouth.    . progesterone (PROMETRIUM) 100 MG capsule Take 200 mg by mouth at bedtime.  2  . topiramate (TOPAMAX) 50 MG tablet Take 1 tablet by mouth at bedtime.    . methylPREDNISolone (MEDROL DOSEPAK) 4 MG TBPK tablet follow package directions 21 tablet 1  . nortriptyline (PAMELOR) 10 MG capsule Take 1 capsule (10 mg total) by mouth at bedtime. 30 capsule 11  . rizatriptan (MAXALT) 10 MG tablet Take 1 tablet (10 mg total) by mouth as needed for migraine. May repeat in 2 hours if needed 10 tablet 11   No current facility-administered medications for this visit.     Allergies as of 06/12/2017 - Review Complete 06/12/2017  Allergen Reaction Noted  . Amoxil [amoxicillin]  02/26/2014  . Biaxin [clarithromycin]  02/26/2014  . Entex lq [phenylephrine-guaifenesin]  02/26/2014  . Guaifenesin & derivatives  02/26/2014  . Phenylpropanolamine  02/26/2014    Vitals: BP (!) 103/52 (BP Location: Right Arm, Patient Position: Sitting)   Pulse 72   Ht 5\' 9"  (1.753 m)   Wt 137 lb 9.6 oz (62.4 kg)   BMI 20.32 kg/m  Last Weight:  Wt  Readings from Last 1 Encounters:  06/12/17 137 lb 9.6 oz (62.4 kg)   Last Height:   Ht Readings from Last 1 Encounters:  06/12/17 5\' 9"  (1.753 m)   Physical exam: Exam: Gen: NAD, conversant, well nourised, well groomed                     CV: RRR, no MRG. No Carotid Bruits. No peripheral edema, warm, nontender Eyes: Conjunctivae clear without exudates or hemorrhage  Neuro: Detailed  Neurologic Exam  Speech:    Speech is normal; fluent and spontaneous with normal comprehension.  Cognition:    The patient is oriented to person, place, and time;     recent and remote memory intact;     language fluent;     normal attention, concentration,     fund of knowledge Cranial Nerves:    The pupils are equal, round, and reactive to light. The fundi are normal and spontaneous venous pulsations are present. Visual fields are full to finger confrontation. Extraocular movements are intact. Trigeminal sensation is intact and the muscles of mastication are normal. The face is symmetric. The palate elevates in the midline. Hearing intact. Voice is normal. Shoulder shrug is normal. The tongue has normal motion without fasciculations.   Coordination:    Normal finger to nose and heel to shin. Normal rapid alternating movements.   Gait:    Heel-toe and tandem gait are normal.   Motor Observation:    No asymmetry, no atrophy, and no involuntary movements noted. Tone:    Normal muscle tone.    Posture:    Posture is normal. normal erect    Strength:    Strength is V/V in the upper and lower limbs.      Sensation: intact to LT     Reflex Exam:  DTR's:    Deep tendon reflexes in the upper and lower extremities are normal bilaterally.   Toes:    The toes are downgoing bilaterally.   Clonus:    Clonus is absent.       Assessment/Plan:  46 year old female with persistent left-sided headache, worse positionally, intractable daily headaches,  No PMHx of migraines of headaches. May be  migraines but need to rule out all other etiologies.   Need MRI brain of the brain w/wo contrast due to new onset intractable headache, positional quality is concerning, need to rule out space-occupying mass or other tumor or lesion.    Magnesium citrate 400mg  to 600mg  daily, riboflavin 400mg  daily, Coenzyme Q 10 100mg  three times daily Consider joining Facebook group [facility].   Start Nortriptyline 10mg  at bedtime Medrol dosepak for 6 days Maxalt(Rizatriptan): Please take one tablet at the onset of your headache. If it does not improve the symptoms please take one additional tablet. Do not take more then 2 tablets in 24hrs. Do not take use more then 2 to 3 times in a week.  Orders Placed This Encounter  Procedures  . MR BRAIN W WO CONTRAST   Cc: Dr. Drema Dallas  Discussed: To prevent or relieve headaches, try the following: Cool Compress. Lie down and place a cool compress on your head.  Avoid headache triggers. If certain foods or odors seem to have triggered your migraines in the past, avoid them. A headache diary might help you identify triggers.  Include physical activity in your daily routine. Try a daily walk or other moderate aerobic exercise.  Manage stress. Find healthy ways to cope with the stressors, such as delegating tasks on your to-do list.  Practice relaxation techniques. Try deep breathing, yoga, massage and visualization.  Eat regularly. Eating regularly scheduled meals and maintaining a healthy diet might help prevent headaches. Also, drink plenty of fluids.  Follow a regular sleep schedule. Sleep deprivation might contribute to headaches Consider biofeedback. With this mind-body technique, you learn to control certain bodily functions - such as muscle tension, heart rate and blood pressure - to prevent headaches or reduce headache pain.  Proceed to emergency room if you experience new or worsening symptoms or symptoms do not resolve, if you have new  neurologic symptoms or if headache is severe, or for any concerning symptom.   Provided education and documentation from American headache Society toolbox including articles on: chronic migraine medication overuse headache, chronic migraines, prevention of migraines, behavioral and other nonpharmacologic treatments for headache.    Sarina Ill, MD  [facility] [address] [place], [place] [address]  Phone [phone] Fax [phone]

## 2017-06-12 NOTE — Telephone Encounter (Signed)
Called patient, offered an appt TODAY 12/12 at 1:30 or 2:00. She will call right back and let us know.

## 2017-06-12 NOTE — Telephone Encounter (Signed)
Pt called back and spoke with Danielle. Per Andee Poles, she would like the 1:30 appt 12/12. Patient scheduled.

## 2017-06-18 ENCOUNTER — Other Ambulatory Visit: Payer: BLUE CROSS/BLUE SHIELD

## 2017-06-18 DIAGNOSIS — N951 Menopausal and female climacteric states: Secondary | ICD-10-CM | POA: Diagnosis not present

## 2017-07-10 ENCOUNTER — Other Ambulatory Visit: Payer: BLUE CROSS/BLUE SHIELD

## 2017-07-10 DIAGNOSIS — R509 Fever, unspecified: Secondary | ICD-10-CM | POA: Diagnosis not present

## 2017-07-10 DIAGNOSIS — J209 Acute bronchitis, unspecified: Secondary | ICD-10-CM | POA: Diagnosis not present

## 2017-07-17 ENCOUNTER — Ambulatory Visit: Payer: BLUE CROSS/BLUE SHIELD

## 2017-07-17 DIAGNOSIS — G8929 Other chronic pain: Secondary | ICD-10-CM

## 2017-07-17 DIAGNOSIS — R519 Headache, unspecified: Secondary | ICD-10-CM

## 2017-07-17 DIAGNOSIS — R51 Headache with orthostatic component, not elsewhere classified: Secondary | ICD-10-CM

## 2017-07-17 MED ORDER — GADOPENTETATE DIMEGLUMINE 469.01 MG/ML IV SOLN: 13.0000 mL | Freq: Once | INTRAVENOUS | Status: AC | PRN

## 2017-07-19 ENCOUNTER — Telehealth: Payer: Self-pay | Admitting: *Deleted

## 2017-07-19 NOTE — Telephone Encounter (Signed)
Pt returned call and verbally understood that MRI was normal

## 2017-07-19 NOTE — Telephone Encounter (Addendum)
Called patient and LVM asking for call back. If she calls back please let her know that her MRI brain is normal.   ----- Message from Melvenia Beam, MD sent at 07/19/2017  9:03 AM EST ----- Mri brain normal

## 2017-08-09 DIAGNOSIS — L570 Actinic keratosis: Secondary | ICD-10-CM | POA: Diagnosis not present

## 2017-08-09 DIAGNOSIS — L821 Other seborrheic keratosis: Secondary | ICD-10-CM | POA: Diagnosis not present

## 2017-08-09 DIAGNOSIS — I781 Nevus, non-neoplastic: Secondary | ICD-10-CM | POA: Diagnosis not present

## 2017-08-12 ENCOUNTER — Encounter: Payer: Self-pay | Admitting: Neurology

## 2017-08-12 ENCOUNTER — Ambulatory Visit: Payer: BLUE CROSS/BLUE SHIELD | Admitting: Neurology

## 2017-08-12 VITALS — BP 94/53 | HR 80 | Ht 69.0 in | Wt 139.0 lb

## 2017-08-12 DIAGNOSIS — G43009 Migraine without aura, not intractable, without status migrainosus: Secondary | ICD-10-CM

## 2017-08-12 NOTE — Progress Notes (Signed)
[id number]    Provider:  Dr Jaynee Eagles Referring Provider: Leighton Ruff, MD Primary Care Physician:  Leighton Ruff, MD  CC:  Migraines  Interval history 08/12/2017:  Patient here for follow up of migraines. Reviewed MRI brain images today with patient, was normal. No aura. She is doing exceptionally well, no headaches, doing great on Nortriptyline, Reviewed images in detail with patient of her brain, answered ann questions, follow up in one year or sooner if necessary. Email with any non-urgent issues.  ROS: no headaches, endorses stress otherwise negative  HPI:  Madeline Walker is a 47 y.o. female here as a referral from Dr. Drema Dallas for past medical history of benign positional vertigo, stress and anxiety, chest pain and palpitations, anxiety. She was started on Topiramate. New headache started 6 months ago. It is on the left side of her head. She also feels it in the back of her head. Usually over her left eye. Worse positionally when bending over for example, feels like pressure like her head was going to "blow", bending over would start the headache and last the whole day. Sometimes it is dull, sometimes it throbs so much she feels it in her neck, she stopped taking OTC meds, advil did not help. She cannot tolerate Topiramate. She can have related nausea, sound sensitivity - sound can worsen the headache. Movement makes it worse. No vomiting. Quiet dark rooms will help. Mother with migraines. She has daily headaches. All are migrainous. Severity varies, stress makes it worse. No other focal neurologic deficits, associated symptoms, inciting events or modifiable factors.  Medications tried: Topiramate, advil, citalopram, on Nortriptyline  Reviewed notes, labs and imaging from outside physicians, which showed:  TSH normal  Reviewed primary care notes.  This is a patient with 6 weeks of persistent left-sided headache worse on lying down at night, wakes with headaches  persistent daily, over-the-counter medications such as NSAIDs not working.  No dizziness, no blurred vision, sometimes she has nausea, not aggravated by any foods, stress makes it worse, husband reports she is also snoring, left frontal headache daily.  Last year she was having a lot of pressure headaches.  No weakness no numbness.  Sleeps 8-9 hours.  No head injuries, no inciting events.  Headaches.  Review of Systems: Patient complains of symptoms per HPI as well as the following symptoms: headache. Pertinent negatives and positives per HPI. All others negative.   Social History   Socioeconomic History  . Marital status: Married    Spouse name: Not on file  . Number of children: 2  . Years of education: 13  . Highest education level: Master's degree (e.g., MA, MS, MEng, MEd, MSW, MBA)  Social Needs  . Financial resource strain: Not on file  . Food insecurity - worry: Not on file  . Food insecurity - inability: Not on file  . Transportation needs - medical: Not on file  . Transportation needs - non-medical: Not on file  Occupational History  . Not on file  Tobacco Use  . Smoking status: Never Smoker  . Smokeless tobacco: Never Used  Substance and Sexual Activity  . Alcohol use: Yes    Alcohol/week: 0.5 oz    Types: 1 Standard drinks or equivalent per week    Comment: week  . Drug use: No  . Sexual activity: Not Currently    Comment: menopause  Other Topics Concern  . Not on file  Social History Narrative   Lives at home with husband and children  Right handed   1/2 cup coffee in the morning    Family History  Problem Relation Age of Onset  . COPD Mother   . Mental illness Mother        DEPRESSION AND SUICIDE  . Migraines Mother   . Hypertension Father   . Cancer Father        KIDNEY AND PROSTATE  . Anemia Sister   . Autism Son   . Cancer Maternal Uncle        LUNG  . Rheum arthritis Maternal Grandmother   . Diabetes Maternal Grandfather        TYPE I  .  Cancer Paternal Grandfather        colon    Past Medical History:  Diagnosis Date  . BPPV (benign paroxysmal positional vertigo)    [facility]  . Breast cyst   . Cancer ([place])    Melanoma  . Chest pain   . Fibrocystic breast   . History of chicken pox   . Infection    HX OF OCCASIONAL UTI  . Infertility, female   . Ovarian cyst   . Palpitations   . Premature ovarian failure   . Yeast infection     Past Surgical History:  Procedure Laterality Date  . ABDOMINOPLASTY  2015  . CESAREAN SECTION    . CESAREAN SECTION  10/31/2011   Procedure: CESAREAN SECTION;  Surgeon: Eldred Manges, MD;  Location: [facility];  Service: Gynecology;  Laterality: N/A;  . DILATION AND CURETTAGE OF UTERUS  2010   SAB  . RHINOPLASTY      Current Outpatient Medications  Medication Sig Dispense Refill  . citalopram (CELEXA) 20 MG tablet Take 20 mg by mouth daily.    . Estradiol (YUVAFEM) 10 MCG TABS vaginal tablet Place 1 tablet vaginally 2 (two) times a week. For 5 weeks    . Multiple Vitamin (MULTIVITAMIN) tablet Take 1 tablet by mouth daily.    . nortriptyline (PAMELOR) 10 MG capsule Take 1 capsule (10 mg total) by mouth at bedtime. 30 capsule 11  . PRESCRIPTION MEDICATION Place rectally every 4 (four) months. Testosterone PR inserted at GYN office    . PRESCRIPTION MEDICATION Place rectally every 4 (four) months. Estradiol PR inserted at GYN office    . Probiotic Product (PROBIOTIC FORMULA PO) Take by mouth.    . progesterone (PROMETRIUM) 100 MG capsule Take 200 mg by mouth at bedtime.  2  . rizatriptan (MAXALT) 10 MG tablet Take 1 tablet (10 mg total) by mouth as needed for migraine. May repeat in 2 hours if needed 10 tablet 11   No current facility-administered medications for this visit.    Facility-Administered Medications Ordered in Other Visits  Medication Dose Route Frequency Provider Last Rate Last Dose  . gadopentetate dimeglumine (MAGNEVIST) injection 13 mL  13 mL Intravenous Once  PRN Melvenia Beam, MD        Allergies as of 08/12/2017 - Review Complete 08/12/2017  Allergen Reaction Noted  . Amoxil [amoxicillin]  02/26/2014  . Biaxin [clarithromycin]  02/26/2014  . Entex lq [phenylephrine-guaifenesin]  02/26/2014  . Guaifenesin & derivatives  02/26/2014  . Phenylpropanolamine  02/26/2014    Vitals: BP (!) 94/53 (BP Location: Right Arm, Patient Position: Sitting) Comment: usually SBP around 100 per pt  Pulse 80   Ht 5\' 9"  (1.753 m)   Wt 139 lb (63 kg)   LMP 01/29/2011   BMI 20.53 kg/m  Last Weight:  Wt Readings  from Last 1 Encounters:  08/12/17 139 lb (63 kg)   Last Height:   Ht Readings from Last 1 Encounters:  08/12/17 5\' 9"  (1.753 m)   Physical exam: Exam: Gen: NAD, conversant, well nourised, well groomed                     CV: RRR, no MRG. No Carotid Bruits. No peripheral edema, warm, nontender Eyes: Conjunctivae clear without exudates or hemorrhage  Neuro: Detailed Neurologic Exam  Speech:    Speech is normal; fluent and spontaneous with normal comprehension.  Cognition:    The patient is oriented to person, place, and time;     recent and remote memory intact;     language fluent;     normal attention, concentration,     fund of knowledge Cranial Nerves:    The pupils are equal, round, and reactive to light. The fundi are normal and spontaneous venous pulsations are present. Visual fields are full to finger confrontation. Extraocular movements are intact. Trigeminal sensation is intact and the muscles of mastication are normal. The face is symmetric. The palate elevates in the midline. Hearing intact. Voice is normal. Shoulder shrug is normal. The tongue has normal motion without fasciculations.   Coordination:    Normal finger to nose and heel to shin. Normal rapid alternating movements.   Gait:    Heel-toe and tandem gait are normal.   Motor Observation:    No asymmetry, no atrophy, and no involuntary movements noted. Tone:     Normal muscle tone.    Posture:    Posture is normal. normal erect    Strength:    Strength is V/V in the upper and lower limbs.      Sensation: intact to LT     Reflex Exam:  DTR's:    Deep tendon reflexes in the upper and lower extremities are normal bilaterally.   Toes:    The toes are downgoing bilaterally.   Clonus:    Clonus is absent.       Assessment/Plan:  47 year old female with migraines, FHx of migraine with aura in mother. MRI brain normal. Significantly improved on Nortriptyline.  Need MRI brain of the brain w/wo contrast normal Magnesium citrate 400mg  to 600mg  daily, riboflavin 400mg  daily, Coenzyme Q 10 100mg  three times daily Continue Nortriptyline 10mg  at bedtime, may need to adjust in the future email if needed Medrol dosepak for 6 days - helped Maxalt(Rizatriptan): Please take one tablet at the onset of your headache. If it does not improve the symptoms please take one additional tablet. Do not take more then 2 tablets in 24hrs. Do not take use more then 2 to 3 times in a week.   Cc: Dr. Drema Dallas  Discussed: To prevent or relieve headaches, try the following: Cool Compress. Lie down and place a cool compress on your head.  Avoid headache triggers. If certain foods or odors seem to have triggered your migraines in the past, avoid them. A headache diary might help you identify triggers.  Include physical activity in your daily routine. Try a daily walk or other moderate aerobic exercise.  Manage stress. Find healthy ways to cope with the stressors, such as delegating tasks on your to-do list.  Practice relaxation techniques. Try deep breathing, yoga, massage and visualization.  Eat regularly. Eating regularly scheduled meals and maintaining a healthy diet might help prevent headaches. Also, drink plenty of fluids.  Follow a regular sleep schedule. Sleep deprivation might  contribute to headaches Consider biofeedback. With this mind-body technique, you learn  to control certain bodily functions - such as muscle tension, heart rate and blood pressure - to prevent headaches or reduce headache pain.    Proceed to emergency room if you experience new or worsening symptoms or symptoms do not resolve, if you have new neurologic symptoms or if headache is severe, or for any concerning symptom.   Provided education and documentation from American headache Society toolbox including articles on: chronic migraine medication overuse headache, chronic migraines, prevention of migraines, behavioral and other nonpharmacologic treatments for headache.    Sarina Ill, MD  [facility] [address] [place], [place] [address]  Phone [phone] Fax [phone]

## 2017-10-15 DIAGNOSIS — F438 Other reactions to severe stress: Secondary | ICD-10-CM | POA: Diagnosis not present

## 2017-10-15 DIAGNOSIS — E282 Polycystic ovarian syndrome: Secondary | ICD-10-CM | POA: Diagnosis not present

## 2017-10-15 DIAGNOSIS — N951 Menopausal and female climacteric states: Secondary | ICD-10-CM | POA: Diagnosis not present

## 2017-10-25 DIAGNOSIS — H5213 Myopia, bilateral: Secondary | ICD-10-CM | POA: Diagnosis not present

## 2018-01-12 ENCOUNTER — Encounter (HOSPITAL_COMMUNITY): Payer: Self-pay | Admitting: Emergency Medicine

## 2018-01-12 ENCOUNTER — Inpatient Hospital Stay (HOSPITAL_COMMUNITY)
Admission: EM | Admit: 2018-01-12 | Discharge: 2018-01-16 | DRG: 331 | Disposition: A | Discharge status: Home / Self Care | Payer: BLUE CROSS/BLUE SHIELD | Attending: Surgery | Admitting: Surgery

## 2018-01-12 DIAGNOSIS — Z8249 Family history of ischemic heart disease and other diseases of the circulatory system: Secondary | ICD-10-CM

## 2018-01-12 DIAGNOSIS — Z85820 Personal history of malignant melanoma of skin: Secondary | ICD-10-CM | POA: Diagnosis not present

## 2018-01-12 DIAGNOSIS — K660 Peritoneal adhesions (postprocedural) (postinfection): Secondary | ICD-10-CM | POA: Diagnosis not present

## 2018-01-12 DIAGNOSIS — K388 Other specified diseases of appendix: Secondary | ICD-10-CM | POA: Diagnosis not present

## 2018-01-12 DIAGNOSIS — Z825 Family history of asthma and other chronic lower respiratory diseases: Secondary | ICD-10-CM | POA: Diagnosis not present

## 2018-01-12 DIAGNOSIS — Z833 Family history of diabetes mellitus: Secondary | ICD-10-CM | POA: Diagnosis not present

## 2018-01-12 DIAGNOSIS — Z809 Family history of malignant neoplasm, unspecified: Secondary | ICD-10-CM | POA: Diagnosis not present

## 2018-01-12 DIAGNOSIS — Z818 Family history of other mental and behavioral disorders: Secondary | ICD-10-CM | POA: Diagnosis not present

## 2018-01-12 DIAGNOSIS — Q438 Other specified congenital malformations of intestine: Secondary | ICD-10-CM | POA: Diagnosis not present

## 2018-01-12 DIAGNOSIS — Z8261 Family history of arthritis: Secondary | ICD-10-CM

## 2018-01-12 DIAGNOSIS — K55049 Acute infarction of large intestine, extent unspecified: Secondary | ICD-10-CM | POA: Diagnosis not present

## 2018-01-12 DIAGNOSIS — K5730 Diverticulosis of large intestine without perforation or abscess without bleeding: Secondary | ICD-10-CM | POA: Diagnosis not present

## 2018-01-12 DIAGNOSIS — R109 Unspecified abdominal pain: Secondary | ICD-10-CM | POA: Diagnosis not present

## 2018-01-12 DIAGNOSIS — K559 Vascular disorder of intestine, unspecified: Secondary | ICD-10-CM | POA: Diagnosis not present

## 2018-01-12 DIAGNOSIS — G43909 Migraine, unspecified, not intractable, without status migrainosus: Secondary | ICD-10-CM | POA: Diagnosis not present

## 2018-01-12 DIAGNOSIS — K562 Volvulus: Secondary | ICD-10-CM | POA: Diagnosis not present

## 2018-01-12 HISTORY — DX: Malignant melanoma of skin, unspecified: C43.9

## 2018-01-12 HISTORY — DX: Migraine, unspecified, not intractable, without status migrainosus: G43.909

## 2018-01-12 LAB — I-STAT BETA HCG BLOOD, ED (MC, WL, AP ONLY)

## 2018-01-12 LAB — CBC
HCT: 41 % (ref 36.0–46.0)
HEMOGLOBIN: 13.5 g/dL (ref 12.0–15.0)
MCH: 30.7 pg (ref 26.0–34.0)
MCHC: 32.9 g/dL (ref 30.0–36.0)
MCV: 93.2 fL (ref 78.0–100.0)
PLATELETS: 249 10*3/uL (ref 150–400)
RBC: 4.4 MIL/uL (ref 3.87–5.11)
RDW: 12.5 % (ref 11.5–15.5)
WBC: 7.5 10*3/uL (ref 4.0–10.5)

## 2018-01-12 NOTE — ED Triage Notes (Signed)
Pt reports R sided abdominal pain and distention worsening after taking metamucil today. Hx diverticulosis. Last bowel movement today, no blood per rectum. Denies fevers.

## 2018-01-12 NOTE — ED Notes (Signed)
Pt unable to void at this time. 

## 2018-01-13 ENCOUNTER — Emergency Department (HOSPITAL_COMMUNITY): Payer: BLUE CROSS/BLUE SHIELD | Admitting: Anesthesiology

## 2018-01-13 ENCOUNTER — Encounter (HOSPITAL_COMMUNITY): Admission: EM | Disposition: A | Discharge status: Home / Self Care | Payer: Self-pay | Source: Home / Self Care

## 2018-01-13 ENCOUNTER — Other Ambulatory Visit: Payer: Self-pay

## 2018-01-13 ENCOUNTER — Encounter (HOSPITAL_COMMUNITY): Payer: Self-pay | Admitting: Certified Registered Nurse Anesthetist

## 2018-01-13 ENCOUNTER — Emergency Department (HOSPITAL_COMMUNITY): Payer: BLUE CROSS/BLUE SHIELD

## 2018-01-13 DIAGNOSIS — Z833 Family history of diabetes mellitus: Secondary | ICD-10-CM | POA: Diagnosis not present

## 2018-01-13 DIAGNOSIS — K562 Volvulus: Secondary | ICD-10-CM | POA: Diagnosis not present

## 2018-01-13 DIAGNOSIS — Q438 Other specified congenital malformations of intestine: Secondary | ICD-10-CM | POA: Diagnosis not present

## 2018-01-13 DIAGNOSIS — Z85820 Personal history of malignant melanoma of skin: Secondary | ICD-10-CM | POA: Diagnosis not present

## 2018-01-13 DIAGNOSIS — Z818 Family history of other mental and behavioral disorders: Secondary | ICD-10-CM | POA: Diagnosis not present

## 2018-01-13 DIAGNOSIS — Z8261 Family history of arthritis: Secondary | ICD-10-CM | POA: Diagnosis not present

## 2018-01-13 DIAGNOSIS — K388 Other specified diseases of appendix: Secondary | ICD-10-CM | POA: Diagnosis not present

## 2018-01-13 DIAGNOSIS — Z809 Family history of malignant neoplasm, unspecified: Secondary | ICD-10-CM | POA: Diagnosis not present

## 2018-01-13 DIAGNOSIS — R109 Unspecified abdominal pain: Secondary | ICD-10-CM | POA: Diagnosis not present

## 2018-01-13 DIAGNOSIS — K559 Vascular disorder of intestine, unspecified: Secondary | ICD-10-CM | POA: Diagnosis not present

## 2018-01-13 DIAGNOSIS — K55049 Acute infarction of large intestine, extent unspecified: Secondary | ICD-10-CM | POA: Diagnosis not present

## 2018-01-13 DIAGNOSIS — G43909 Migraine, unspecified, not intractable, without status migrainosus: Secondary | ICD-10-CM | POA: Diagnosis present

## 2018-01-13 DIAGNOSIS — Z8249 Family history of ischemic heart disease and other diseases of the circulatory system: Secondary | ICD-10-CM | POA: Diagnosis not present

## 2018-01-13 DIAGNOSIS — Z825 Family history of asthma and other chronic lower respiratory diseases: Secondary | ICD-10-CM | POA: Diagnosis not present

## 2018-01-13 DIAGNOSIS — K5730 Diverticulosis of large intestine without perforation or abscess without bleeding: Secondary | ICD-10-CM | POA: Diagnosis present

## 2018-01-13 DIAGNOSIS — K660 Peritoneal adhesions (postprocedural) (postinfection): Secondary | ICD-10-CM | POA: Diagnosis present

## 2018-01-13 HISTORY — PX: COLON SURGERY: SHX602

## 2018-01-13 HISTORY — PX: LAPAROTOMY: SHX154

## 2018-01-13 LAB — COMPREHENSIVE METABOLIC PANEL
ALT: 24 U/L (ref 0–44)
AST: 26 U/L (ref 15–41)
Albumin: 3.9 g/dL (ref 3.5–5.0)
Alkaline Phosphatase: 41 U/L (ref 38–126)
Anion gap: 7 (ref 5–15)
BILIRUBIN TOTAL: 0.5 mg/dL (ref 0.3–1.2)
BUN: 16 mg/dL (ref 6–20)
CO2: 26 mmol/L (ref 22–32)
CREATININE: 0.82 mg/dL (ref 0.44–1.00)
Calcium: 8.9 mg/dL (ref 8.9–10.3)
Chloride: 106 mmol/L (ref 98–111)
GFR calc Af Amer: >60 mL/min (ref >60)
Glucose, Bld: 99 mg/dL (ref 70–99)
Potassium: 4 mmol/L (ref 3.5–5.1)
Sodium: 139 mmol/L (ref 135–145)
TOTAL PROTEIN: 6.3 g/dL — AB (ref 6.5–8.1)

## 2018-01-13 LAB — URINALYSIS, ROUTINE W REFLEX MICROSCOPIC
Bilirubin Urine: NEGATIVE
Glucose, UA: NEGATIVE mg/dL
Hgb urine dipstick: NEGATIVE
KETONES UR: NEGATIVE mg/dL
Nitrite: NEGATIVE
PH: 7 (ref 5.0–8.0)
Protein, ur: NEGATIVE mg/dL
Specific Gravity, Urine: 1.018 (ref 1.005–1.030)

## 2018-01-13 LAB — LIPASE, BLOOD: Lipase: 58 U/L — ABNORMAL HIGH (ref 11–51)

## 2018-01-13 SURGERY — LAPAROTOMY, EXPLORATORY
Anesthesia: General | Site: Abdomen

## 2018-01-13 MED ORDER — PHENYLEPHRINE HCL 10 MG/ML IJ SOLN
INTRAMUSCULAR | Status: DC | PRN
  Administered 2018-01-13: 25 ug/min via INTRAVENOUS

## 2018-01-13 MED ORDER — HYDROMORPHONE HCL 1 MG/ML IJ SOLN
INTRAMUSCULAR | Status: DC | PRN
  Administered 2018-01-13: 0.5 mg via INTRAVENOUS

## 2018-01-13 MED ORDER — MEPERIDINE HCL 50 MG/ML IJ SOLN: 6.2500 mg | INTRAMUSCULAR | Status: DC | PRN

## 2018-01-13 MED ORDER — ONDANSETRON HCL 4 MG/2ML IJ SOLN: 4.0000 mg | Freq: Once | INTRAMUSCULAR | Status: DC | PRN

## 2018-01-13 MED ORDER — PROPOFOL 10 MG/ML IV BOLUS
INTRAVENOUS | Status: AC
  Filled 2018-01-13: qty 20

## 2018-01-13 MED ORDER — DEXTROSE 5 % IV SOLN
INTRAVENOUS | Status: DC | PRN
  Administered 2018-01-13: 1 g via INTRAVENOUS

## 2018-01-13 MED ORDER — HYDROMORPHONE HCL 1 MG/ML IJ SOLN
0.2500 mg | INTRAMUSCULAR | Status: DC | PRN
  Administered 2018-01-13 (×2): 0.5 mg via INTRAVENOUS

## 2018-01-13 MED ORDER — NORTRIPTYLINE HCL 10 MG PO CAPS
10.0000 mg | ORAL_CAPSULE | Freq: Every day | ORAL | Status: DC
  Administered 2018-01-13 – 2018-01-15 (×3): 10 mg via ORAL
  Filled 2018-01-13 (×4): qty 1

## 2018-01-13 MED ORDER — KETOROLAC TROMETHAMINE 30 MG/ML IJ SOLN
30.0000 mg | Freq: Four times a day (QID) | INTRAMUSCULAR | Status: AC
  Administered 2018-01-13: 30 mg via INTRAVENOUS

## 2018-01-13 MED ORDER — ONDANSETRON HCL 4 MG/2ML IJ SOLN
INTRAMUSCULAR | Status: AC
  Filled 2018-01-13: qty 2

## 2018-01-13 MED ORDER — SODIUM CHLORIDE 0.9 % IV SOLN
INTRAVENOUS | Status: DC
  Administered 2018-01-13 (×2): via INTRAVENOUS

## 2018-01-13 MED ORDER — LIDOCAINE 2% (20 MG/ML) 5 ML SYRINGE
INTRAMUSCULAR | Status: DC | PRN
  Administered 2018-01-13: 100 mg via INTRAVENOUS

## 2018-01-13 MED ORDER — LACTATED RINGERS IV SOLN
INTRAVENOUS | Status: DC | PRN
  Administered 2018-01-13 (×2): via INTRAVENOUS

## 2018-01-13 MED ORDER — PHENYLEPHRINE 40 MCG/ML (10ML) SYRINGE FOR IV PUSH (FOR BLOOD PRESSURE SUPPORT)
PREFILLED_SYRINGE | INTRAVENOUS | Status: AC
  Filled 2018-01-13: qty 10

## 2018-01-13 MED ORDER — MIDAZOLAM HCL 2 MG/2ML IJ SOLN
INTRAMUSCULAR | Status: AC
  Filled 2018-01-13: qty 2

## 2018-01-13 MED ORDER — HYDROMORPHONE HCL 1 MG/ML IJ SOLN
INTRAMUSCULAR | Status: AC
  Filled 2018-01-13: qty 0.5

## 2018-01-13 MED ORDER — MIDAZOLAM HCL 5 MG/5ML IJ SOLN
INTRAMUSCULAR | Status: DC | PRN
  Administered 2018-01-13: 2 mg via INTRAVENOUS

## 2018-01-13 MED ORDER — ENOXAPARIN SODIUM 40 MG/0.4ML ~~LOC~~ SOLN
40.0000 mg | SUBCUTANEOUS | Status: DC
  Administered 2018-01-14 – 2018-01-16 (×3): 40 mg via SUBCUTANEOUS
  Filled 2018-01-13 (×3): qty 0.4

## 2018-01-13 MED ORDER — DEXAMETHASONE SODIUM PHOSPHATE 10 MG/ML IJ SOLN
INTRAMUSCULAR | Status: DC | PRN
  Administered 2018-01-13: 10 mg via INTRAVENOUS

## 2018-01-13 MED ORDER — ACETAMINOPHEN 10 MG/ML IV SOLN
1000.0000 mg | Freq: Four times a day (QID) | INTRAVENOUS | Status: AC
  Administered 2018-01-13 – 2018-01-14 (×4): 1000 mg via INTRAVENOUS
  Filled 2018-01-13 (×4): qty 100

## 2018-01-13 MED ORDER — KETOROLAC TROMETHAMINE 30 MG/ML IJ SOLN
INTRAMUSCULAR | Status: AC
  Filled 2018-01-13: qty 1

## 2018-01-13 MED ORDER — ACETAMINOPHEN 650 MG RE SUPP: 650.0000 mg | Freq: Four times a day (QID) | RECTAL | Status: DC | PRN

## 2018-01-13 MED ORDER — FENTANYL CITRATE (PF) 100 MCG/2ML IJ SOLN
INTRAMUSCULAR | Status: DC | PRN
  Administered 2018-01-13: 25 ug via INTRAVENOUS
  Administered 2018-01-13: 50 ug via INTRAVENOUS
  Administered 2018-01-13: 150 ug via INTRAVENOUS
  Administered 2018-01-13: 50 ug via INTRAVENOUS

## 2018-01-13 MED ORDER — ONDANSETRON HCL 4 MG/2ML IJ SOLN
4.0000 mg | Freq: Four times a day (QID) | INTRAMUSCULAR | Status: DC | PRN
  Administered 2018-01-13: 4 mg via INTRAVENOUS
  Filled 2018-01-13: qty 2

## 2018-01-13 MED ORDER — SUCCINYLCHOLINE CHLORIDE 200 MG/10ML IV SOSY
PREFILLED_SYRINGE | INTRAVENOUS | Status: DC | PRN
  Administered 2018-01-13: 120 mg via INTRAVENOUS

## 2018-01-13 MED ORDER — KETOROLAC TROMETHAMINE 30 MG/ML IJ SOLN
INTRAMUSCULAR | Status: DC | PRN
  Administered 2018-01-13: 30 mg via INTRAVENOUS

## 2018-01-13 MED ORDER — NEOSTIGMINE METHYLSULFATE 5 MG/5ML IV SOSY
PREFILLED_SYRINGE | INTRAVENOUS | Status: AC
  Filled 2018-01-13: qty 5

## 2018-01-13 MED ORDER — NEOSTIGMINE METHYLSULFATE 10 MG/10ML IV SOLN
INTRAVENOUS | Status: DC | PRN
  Administered 2018-01-13: 3 mg via INTRAVENOUS

## 2018-01-13 MED ORDER — MORPHINE SULFATE (PF) 2 MG/ML IV SOLN
2.0000 mg | INTRAVENOUS | Status: DC | PRN
  Administered 2018-01-13 (×2): 2 mg via INTRAVENOUS
  Filled 2018-01-13 (×2): qty 1

## 2018-01-13 MED ORDER — LIDOCAINE 2% (20 MG/ML) 5 ML SYRINGE
INTRAMUSCULAR | Status: AC
  Filled 2018-01-13: qty 5

## 2018-01-13 MED ORDER — DIPHENHYDRAMINE HCL 25 MG PO CAPS
25.0000 mg | ORAL_CAPSULE | Freq: Four times a day (QID) | ORAL | Status: DC | PRN
  Administered 2018-01-14: 25 mg via ORAL
  Filled 2018-01-13: qty 1

## 2018-01-13 MED ORDER — PROPOFOL 10 MG/ML IV BOLUS
INTRAVENOUS | Status: DC | PRN
  Administered 2018-01-13: 20 mg via INTRAVENOUS
  Administered 2018-01-13: 100 mg via INTRAVENOUS

## 2018-01-13 MED ORDER — ROCURONIUM BROMIDE 10 MG/ML (PF) SYRINGE
PREFILLED_SYRINGE | INTRAVENOUS | Status: AC
  Filled 2018-01-13: qty 10

## 2018-01-13 MED ORDER — ONDANSETRON HCL 4 MG/2ML IJ SOLN
INTRAMUSCULAR | Status: DC | PRN
  Administered 2018-01-13: 4 mg via INTRAVENOUS

## 2018-01-13 MED ORDER — IOHEXOL 300 MG/ML  SOLN
100.0000 mL | Freq: Once | INTRAMUSCULAR | Status: AC | PRN
  Administered 2018-01-13: 100 mL via INTRAVENOUS

## 2018-01-13 MED ORDER — SODIUM CHLORIDE 0.9 % IV SOLN
1.0000 g | Freq: Once | INTRAVENOUS | Status: AC
  Administered 2018-01-13: 1 g via INTRAVENOUS
  Filled 2018-01-13: qty 10

## 2018-01-13 MED ORDER — HYDROMORPHONE HCL 1 MG/ML IJ SOLN
INTRAMUSCULAR | Status: AC
  Filled 2018-01-13: qty 1

## 2018-01-13 MED ORDER — ACETAMINOPHEN 325 MG PO TABS: 650.0000 mg | ORAL_TABLET | Freq: Four times a day (QID) | ORAL | Status: DC | PRN

## 2018-01-13 MED ORDER — ROCURONIUM BROMIDE 10 MG/ML (PF) SYRINGE
PREFILLED_SYRINGE | INTRAVENOUS | Status: DC | PRN
  Administered 2018-01-13: 40 mg via INTRAVENOUS

## 2018-01-13 MED ORDER — ONDANSETRON 4 MG PO TBDP
4.0000 mg | ORAL_TABLET | Freq: Four times a day (QID) | ORAL | Status: DC | PRN
  Administered 2018-01-16: 4 mg via ORAL
  Filled 2018-01-13: qty 1

## 2018-01-13 MED ORDER — GLYCOPYRROLATE 0.2 MG/ML IJ SOLN
INTRAMUSCULAR | Status: DC | PRN
  Administered 2018-01-13: 0.4 mg via INTRAVENOUS

## 2018-01-13 MED ORDER — SUCCINYLCHOLINE CHLORIDE 200 MG/10ML IV SOSY
PREFILLED_SYRINGE | INTRAVENOUS | Status: AC
  Filled 2018-01-13: qty 10

## 2018-01-13 MED ORDER — PHENYLEPHRINE 40 MCG/ML (10ML) SYRINGE FOR IV PUSH (FOR BLOOD PRESSURE SUPPORT)
PREFILLED_SYRINGE | INTRAVENOUS | Status: DC | PRN
  Administered 2018-01-13 (×3): 40 ug via INTRAVENOUS

## 2018-01-13 MED ORDER — ARTIFICIAL TEARS OPHTHALMIC OINT
TOPICAL_OINTMENT | OPHTHALMIC | Status: AC
  Filled 2018-01-13: qty 3.5

## 2018-01-13 MED ORDER — ONDANSETRON HCL 4 MG/2ML IJ SOLN
4.0000 mg | Freq: Four times a day (QID) | INTRAMUSCULAR | Status: DC | PRN
  Administered 2018-01-13 – 2018-01-15 (×4): 4 mg via INTRAVENOUS
  Filled 2018-01-13 (×5): qty 2

## 2018-01-13 MED ORDER — 0.9 % SODIUM CHLORIDE (POUR BTL) OPTIME
TOPICAL | Status: DC | PRN
  Administered 2018-01-13 (×3): 1000 mL

## 2018-01-13 MED ORDER — DIPHENHYDRAMINE HCL 50 MG/ML IJ SOLN: 25.0000 mg | Freq: Four times a day (QID) | INTRAMUSCULAR | Status: DC | PRN

## 2018-01-13 MED ORDER — KETOROLAC TROMETHAMINE 30 MG/ML IJ SOLN: 30.0000 mg | Freq: Four times a day (QID) | INTRAMUSCULAR | Status: DC | PRN

## 2018-01-13 MED ORDER — FENTANYL CITRATE (PF) 250 MCG/5ML IJ SOLN
INTRAMUSCULAR | Status: AC
  Filled 2018-01-13: qty 5

## 2018-01-13 MED ORDER — HYDROMORPHONE HCL 1 MG/ML IJ SOLN
1.0000 mg | INTRAMUSCULAR | Status: DC | PRN
  Administered 2018-01-13 – 2018-01-14 (×3): 1 mg via INTRAVENOUS
  Filled 2018-01-13 (×3): qty 1

## 2018-01-13 MED ORDER — GLYCOPYRROLATE PF 0.2 MG/ML IJ SOSY
PREFILLED_SYRINGE | INTRAMUSCULAR | Status: AC
  Filled 2018-01-13: qty 2

## 2018-01-13 MED ORDER — ALBUMIN HUMAN 5 % IV SOLN
INTRAVENOUS | Status: DC | PRN
  Administered 2018-01-13 (×2): via INTRAVENOUS

## 2018-01-13 MED ORDER — ESTRADIOL 0.05 MG/24HR TD PTWK
0.0500 mg | MEDICATED_PATCH | TRANSDERMAL | Status: DC
  Administered 2018-01-13: 0.05 mg via TRANSDERMAL
  Filled 2018-01-13: qty 1

## 2018-01-13 MED ORDER — ARTIFICIAL TEARS OPHTHALMIC OINT
TOPICAL_OINTMENT | OPHTHALMIC | Status: DC | PRN
  Administered 2018-01-13: 1 via OPHTHALMIC

## 2018-01-13 MED ORDER — SODIUM CHLORIDE 0.9 % IV SOLN
2.0000 g | Freq: Two times a day (BID) | INTRAVENOUS | Status: DC
  Administered 2018-01-13: 2 g via INTRAVENOUS
  Filled 2018-01-13 (×2): qty 2

## 2018-01-13 MED ORDER — DEXAMETHASONE SODIUM PHOSPHATE 10 MG/ML IJ SOLN
INTRAMUSCULAR | Status: AC
  Filled 2018-01-13: qty 1

## 2018-01-13 MED ORDER — MORPHINE SULFATE (PF) 4 MG/ML IV SOLN
4.0000 mg | INTRAVENOUS | Status: DC | PRN
Start: 2018-01-13 — End: 2018-01-13
  Administered 2018-01-13: 4 mg via INTRAVENOUS
  Filled 2018-01-13: qty 1

## 2018-01-13 MED ORDER — SODIUM CHLORIDE 0.9 % IV SOLN
2.0000 g | Freq: Once | INTRAVENOUS | Status: AC
  Filled 2018-01-13: qty 2

## 2018-01-13 MED ORDER — PROGESTERONE MICRONIZED 100 MG PO CAPS
200.0000 mg | ORAL_CAPSULE | Freq: Every day | ORAL | Status: DC
  Administered 2018-01-13 – 2018-01-15 (×3): 200 mg via ORAL
  Filled 2018-01-13 (×4): qty 2

## 2018-01-13 MED ORDER — METHOCARBAMOL 500 MG PO TABS
500.0000 mg | ORAL_TABLET | Freq: Four times a day (QID) | ORAL | Status: DC | PRN
Start: 2018-01-13 — End: 2018-01-14
  Administered 2018-01-13 (×2): 500 mg via ORAL
  Filled 2018-01-13 (×2): qty 1

## 2018-01-13 SURGICAL SUPPLY — 50 items
CANISTER SUCT 3000ML PPV (MISCELLANEOUS) ×2 IMPLANT
CHLORAPREP W/TINT 26ML (MISCELLANEOUS) ×2 IMPLANT
COVER SURGICAL LIGHT HANDLE (MISCELLANEOUS) ×2 IMPLANT
DRAPE LAPAROSCOPIC ABDOMINAL (DRAPES) ×2 IMPLANT
DRAPE WARM FLUID 44X44 (DRAPE) ×2 IMPLANT
DRSG OPSITE POSTOP 4X8 (GAUZE/BANDAGES/DRESSINGS) ×1 IMPLANT
ELECT BLADE 6.5 EXT (BLADE) ×1 IMPLANT
ELECT CAUTERY BLADE 6.4 (BLADE) ×4 IMPLANT
ELECT REM PT RETURN 9FT ADLT (ELECTROSURGICAL) ×2
ELECTRODE REM PT RTRN 9FT ADLT (ELECTROSURGICAL) ×1 IMPLANT
GLOVE BIO SURGEON STRL SZ7 (GLOVE) ×2 IMPLANT
GLOVE BIO SURGEON STRL SZ8 (GLOVE) ×1 IMPLANT
GLOVE BIOGEL PI IND STRL 6.5 (GLOVE) IMPLANT
GLOVE BIOGEL PI IND STRL 7.0 (GLOVE) IMPLANT
GLOVE BIOGEL PI IND STRL 7.5 (GLOVE) ×1 IMPLANT
GLOVE BIOGEL PI IND STRL 8 (GLOVE) IMPLANT
GLOVE BIOGEL PI INDICATOR 6.5 (GLOVE) ×2
GLOVE BIOGEL PI INDICATOR 7.0 (GLOVE) ×1
GLOVE BIOGEL PI INDICATOR 7.5 (GLOVE) ×1
GLOVE BIOGEL PI INDICATOR 8 (GLOVE) ×1
GLOVE SURG SS PI 6.5 STRL IVOR (GLOVE) ×2 IMPLANT
GLOVE SURG SS PI 7.0 STRL IVOR (GLOVE) ×1 IMPLANT
GOWN STRL REUS W/ TWL LRG LVL3 (GOWN DISPOSABLE) ×2 IMPLANT
GOWN STRL REUS W/TWL LRG LVL3 (GOWN DISPOSABLE) ×12
KIT BASIN OR (CUSTOM PROCEDURE TRAY) ×2 IMPLANT
KIT TURNOVER KIT B (KITS) ×2 IMPLANT
LIGASURE IMPACT 36 18CM CVD LR (INSTRUMENTS) ×1 IMPLANT
NS IRRIG 1000ML POUR BTL (IV SOLUTION) ×4 IMPLANT
PACK GENERAL/GYN (CUSTOM PROCEDURE TRAY) ×2 IMPLANT
PAD ARMBOARD 7.5X6 YLW CONV (MISCELLANEOUS) ×2 IMPLANT
RELOAD PROXIMATE 30MM BLUE (ENDOMECHANICALS) ×2 IMPLANT
RELOAD PROXIMATE 75MM BLUE (ENDOMECHANICALS) ×4 IMPLANT
RELOAD STAPLE 30 3.6 BLU REG (ENDOMECHANICALS) IMPLANT
RELOAD STAPLE 75 3.8 BLU REG (ENDOMECHANICALS) IMPLANT
RELOAD STAPLER LINEAR PROX 30 (STAPLE) ×1 IMPLANT
STAPLER GUN LINEAR PROX 60 (STAPLE) ×1 IMPLANT
STAPLER PROXIMATE 75MM BLUE (STAPLE) ×1 IMPLANT
STAPLER RELOAD LINEAR PROX 30 (STAPLE) ×2
STAPLER RELOADABLE 30 BLU REG (STAPLE) IMPLANT
STAPLER VISISTAT 35W (STAPLE) ×2 IMPLANT
SUCTION POOLE TIP (SUCTIONS) ×2 IMPLANT
SUT PDS AB 1 TP1 96 (SUTURE) ×2 IMPLANT
SUT SILK 2 0 SH CR/8 (SUTURE) ×2 IMPLANT
SUT SILK 2 0 TIES 10X30 (SUTURE) ×2 IMPLANT
SUT SILK 3 0 SH CR/8 (SUTURE) ×3 IMPLANT
SUT SILK 3 0 TIES 10X30 (SUTURE) ×2 IMPLANT
SUT VIC AB 2-0 SH 27 (SUTURE) ×2
SUT VIC AB 2-0 SH 27XBRD (SUTURE) IMPLANT
TRAY FOLEY MTR SLVR 16FR STAT (SET/KITS/TRAYS/PACK) ×1 IMPLANT
YANKAUER SUCT BULB TIP NO VENT (SUCTIONS) ×2 IMPLANT

## 2018-01-13 NOTE — Op Note (Signed)
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 Georganna Skeans Anesthesia: General endotracheal Indications: This is a 47 year old female in good health who presents with a history of chronic constipation and sigmoid diverticulosis and a 24-hour history of acute lower abdominal pain.  She was quite distended and had nausea.  She presented to the emergency department for evaluation.  Initially they suspected acute appendicitis.  However a CT scan revealed cecal volvulus.  I examined the patient and recommended immediate exploratory laparotomy.  The patient shows no signs of sepsis but is beginning to develop peritoneal signs.  Description of procedure: The patient is brought to the operating room and placed in the supine position on the operating room table.  After an adequate level of general anesthesia was obtained, a Foley catheter was placed under sterile technique.  The patient's abdomen was prepped with ChloraPrep and draped in sterile fashion.  The patient has had a previous abdominoplasty through a transverse incision.  We made a vertical incision from her previous transverse incision up to just above the umbilicus.  Dissection with was carried down through the subcutaneous tissues with cautery.  We removed some of the sutures from her previous abdominoplasty.  We entered the peritoneal cavity.  There is no sign of perforation or infection.  We open the fascia widely.  The patient has a very distended redundant transverse colon.  We were able to localize the area of volvulus.  There is a very thick thrombosed vein as well as an adhesive band that is constricting the ascending colon just above the cecum.  This is the area of the volvulus.  We divided this band between [place] clamps and this released the volvulus.  The colon  appears to be viable.  Her entire colon is filled with small hard pieces of stool.  The ascending colon transverse colon are very long and redundant with no attachments to the lateral abdominal wall.  The descending colon is more normal in appearance and caliber.  It is also attached to the lateral abdominal wall.  There are some visible diverticuli but no sign of diverticulitis.  Initially our plan was to perform a cecopexy.  We excised the long band coming from the distal transverse colon down to the mesentery of the terminal ileum.  This was sent for pathologic examination.  We also performed an appendectomy with a TA-30 stapler.  We began planning our cecopexy.  It then became very obvious that the patient had far too much redundant transverse colon to perform a safe pexy.  Her transverse colon would be susceptible to recurrent volvulus.  Therefore we made the decision to perform an extended right hemicolectomy.  We divided the transverse colon with a GIA-75 stapler in the distal transverse colon.  We divided the terminal ileum about 10 cm proximal to the ileocecal valve.  The mesentery was then divided with the LigaSure device.  The entire specimen measured 55 cm.  We then created a side-to-side ileocolic anastomosis with a GIA-75 as well as a TA 60 stapler.  A crotch suture of 3-0 silk was placed to reinforce the staple line.  The TA 60 staple line was oversewn with multiple silk sutures.  The mesenteric defect was closed with figure-of-eight 2-0 silk sutures.  The anastomosis is widely patent.  There is much less redundant colon and the patient still has a very long length of  distal transverse colon descending colon and sigmoid colon.  We irrigated the abdomen thoroughly and inspected for hemostasis.  We changed our gowns and gloves in accordance with the colon SSI protocol.  The fascia was reapproximated with double-stranded #1 PDS suture.  The subcutaneous tissues were irrigated and staples were used to  close the skin.  An occlusive dressing was placed.  The patient was then extubated and brought to the recovery room in stable condition.  All sponge, instrument, and needle counts are correct.  Madeline Walker. Madeline Dover, MD, [facility]  General/ Trauma Surgery  01/13/2018 8:25 AM

## 2018-01-13 NOTE — Anesthesia Postprocedure Evaluation (Signed)
Anesthesia Post Note  Patient: Madeline Walker  Procedure(s) Performed: EXPLORATORY LAPAROTOMY AND EXTENDED RIGHT HEMICOLECTOMY (N/A Abdomen)     Patient location during evaluation: PACU Anesthesia Type: General Level of consciousness: awake and alert Pain management: pain level controlled Vital Signs Assessment: post-procedure vital signs reviewed and stable Respiratory status: spontaneous breathing, nonlabored ventilation, respiratory function stable and patient connected to nasal cannula oxygen Cardiovascular status: blood pressure returned to baseline and stable Postop Assessment: no apparent nausea or vomiting Anesthetic complications: no    Last Vitals:  Vitals:   01/13/18 1219 01/13/18 1437  BP: (!) 100/40 (!) 113/44  Pulse:  75  Resp:  18  Temp:  36.7 C  SpO2:  100%    Last Pain:  Vitals:   01/13/18 1830  TempSrc:   PainSc: 6                  Barnet Glasgow

## 2018-01-13 NOTE — Transfer of Care (Signed)
Immediate Anesthesia Transfer of Care Note  Patient: Madeline Walker  Procedure(s) Performed: EXPLORATORY LAPAROTOMY AND EXTENDED RIGHT HEMICOLECTOMY (N/A Abdomen)  Patient Location: PACU  Anesthesia Type:General  Level of Consciousness: patient cooperative and responds to stimulation  Airway & Oxygen Therapy: Patient Spontanous Breathing and Patient connected to face mask oxygen  Post-op Assessment: Report given to RN and Post -op Vital signs reviewed and stable  Post vital signs: Reviewed and stable  Last Vitals:  Vitals Value Taken Time  BP 93/42 01/13/2018  8:25 AM  Temp 36.7 C 01/13/2018  8:25 AM  Pulse 83 01/13/2018  8:29 AM  Resp 15 01/13/2018  8:29 AM  SpO2 100 % 01/13/2018  8:29 AM  Vitals shown include unvalidated device data.  Last Pain:  Vitals:   01/13/18 0825  TempSrc:   PainSc: 0-No pain         Complications: No apparent anesthesia complications

## 2018-01-13 NOTE — Anesthesia Preprocedure Evaluation (Addendum)
Anesthesia Evaluation  Patient identified by MRN, date of birth, ID band Patient awake    Reviewed: Allergy & Precautions, NPO status , Patient's Chart, lab work & pertinent test results  Airway Mallampati: I  TM Distance: >3 FB Neck ROM: Full    Dental  (+) Teeth Intact, Dental Advisory Given   Pulmonary    Pulmonary exam normal        Cardiovascular Normal cardiovascular exam     Neuro/Psych Depression    GI/Hepatic   Endo/Other    Renal/GU      Musculoskeletal   Abdominal   Peds  Hematology   Anesthesia Other Findings   Reproductive/Obstetrics                            Anesthesia Physical Anesthesia Plan  ASA: II and emergent  Anesthesia Plan: General   Post-op Pain Management:    Induction: Intravenous, Cricoid pressure planned and Rapid sequence  PONV Risk Score and Plan: Ondansetron, Dexamethasone, Midazolam and Treatment may vary due to age or medical condition  Airway Management Planned: Oral ETT  Additional Equipment:   Intra-op Plan:   Post-operative Plan: Extubation in OR  Informed Consent: I have reviewed the patients History and Physical, chart, labs and discussed the procedure including the risks, benefits and alternatives for the proposed anesthesia with the patient or authorized representative who has indicated his/her understanding and acceptance.     Plan Discussed with: Surgeon and CRNA  Anesthesia Plan Comments:        Anesthesia Quick Evaluation

## 2018-01-13 NOTE — Progress Notes (Signed)
Patient c/o feeling os having to urinate the last time documented out put was am. Bladder scanned of greater than 676ml with foley. Foley was rtemoved per patient request and she void 250 mls she stated that she felt much better will continue to monitor . Arthor Captain LPN

## 2018-01-13 NOTE — Progress Notes (Signed)
Md on call paged that patient request for pain management not able to tolerate the Morphine. Arthor Captain LPN

## 2018-01-13 NOTE — H&P (Signed)
Madeline Walker is an 47 y.o. female.   Chief Complaint: Abdominal pain HPI: 47 yo female with history of chronic constipation and sigmoid diverticulosis presents with 24 hour of acute lower abdominal pain, distention, and nausea.  This began after a normal bowel movement yesterday morning.  The pain and distention became worse during the day, so she presented to the ED for evaluation.  Appendicitis was suspected, but CT scan showed cecal volvulus.    She had an abdominoplasty/ umbilical hernia repair by Dr. Daleen Snook in 2015.  No mesh in place.  Colonoscopy in 2018 by Dr. Collene Mares reportedly showed sigmoid diverticulosis.    Past Medical History:  Diagnosis Date  . BPPV (benign paroxysmal positional vertigo)    [facility] ENT  . Breast cyst   . Cancer ([place])    Melanoma  . Chest pain   . Fibrocystic breast   . History of chicken pox   . Infection    HX OF OCCASIONAL UTI  . Infertility, female   . Ovarian cyst   . Palpitations   . Premature ovarian failure   . Yeast infection     Past Surgical History:  Procedure Laterality Date  . ABDOMINOPLASTY  2015  . CESAREAN SECTION    . CESAREAN SECTION  10/31/2011   Procedure: CESAREAN SECTION;  Surgeon: Eldred Manges, MD;  Location: [facility];  Service: Gynecology;  Laterality: N/A;  . DILATION AND CURETTAGE OF UTERUS  2010   SAB  . RHINOPLASTY      Family History  Problem Relation Age of Onset  . COPD Mother   . Mental illness Mother        DEPRESSION AND SUICIDE  . Migraines Mother   . Hypertension Father   . Cancer Father        KIDNEY AND PROSTATE  . Anemia Sister   . Autism Son   . Cancer Maternal Uncle        LUNG  . Rheum arthritis Maternal Grandmother   . Diabetes Maternal Grandfather        TYPE I  . Cancer Paternal Grandfather        colon   Social History:  reports that she has never smoked. She has never used smokeless tobacco. She reports that she drinks about 0.6 oz of alcohol per week. She reports that  she does not use drugs.  Allergies: No Known Allergies  Prior to Admission medications   Medication Sig Start Date End Date Taking? Authorizing Provider  estradiol (VIVELLE-DOT) 0.05 MG/24HR patch Place 1 patch onto the skin every 3 (three) days. 11/09/17  Yes [provider]  Estradiol (YUVAFEM) 10 MCG TABS vaginal tablet Place 1 tablet vaginally 2 (two) times a week. For 5 weeks   Yes [provider]  nortriptyline (PAMELOR) 10 MG capsule Take 1 capsule (10 mg total) by mouth at bedtime. 06/12/17  Yes Melvenia Beam, MD  progesterone (PROMETRIUM) 100 MG capsule Take 200 mg by mouth at bedtime. 05/18/17  Yes [provider]  rizatriptan (MAXALT) 10 MG tablet Take 1 tablet (10 mg total) by mouth as needed for migraine. May repeat in 2 hours if needed Patient not taking: Reported on 01/13/2018 06/12/17   Melvenia Beam, MD     Results for orders placed or performed during the hospital encounter of 01/12/18 (from the past 48 hour(s))  Urinalysis, Routine w reflex microscopic     Status: Abnormal   Collection Time: 01/12/18 12:15 AM  Result Value  Ref Range   Color, Urine YELLOW YELLOW   APPearance TURBID (A) CLEAR   Specific Gravity, Urine 1.018 1.005 - 1.030   pH 7.0 5.0 - 8.0   Glucose, UA NEGATIVE NEGATIVE mg/dL   Hgb urine dipstick NEGATIVE NEGATIVE   Bilirubin Urine NEGATIVE NEGATIVE   Ketones, ur NEGATIVE NEGATIVE mg/dL   Protein, ur NEGATIVE NEGATIVE mg/dL   Nitrite NEGATIVE NEGATIVE   Leukocytes, UA LARGE (A) NEGATIVE   RBC / HPF 6-10 0 - 5 RBC/hpf   WBC, UA >50 (H) 0 - 5 WBC/hpf   Bacteria, UA MANY (A) NONE SEEN   Squamous Epithelial / LPF 11-20 0 - 5   Mucus PRESENT    Budding Yeast PRESENT     Comment: Performed at [facility] [address], [place], [place] [address]  Lipase, blood     Status: Abnormal   Collection Time: 01/12/18 11:05 PM  Result Value Ref Range   Lipase 58 (H) 11 - 51 U/L    Comment: Performed at [facility] [address] [place], [place] [address]  Comprehensive metabolic panel     Status: Abnormal   Collection Time: 01/12/18 11:05 PM  Result Value Ref Range   Sodium 139 135 - 145 mmol/L   Potassium 4.0 3.5 - 5.1 mmol/L   Chloride 106 98 - 111 mmol/L    Comment: Please note change in reference range.   CO2 26 22 - 32 mmol/L   Glucose, Bld 99 70 - 99 mg/dL    Comment: Please note change in reference range.   BUN 16 6 - 20 mg/dL    Comment: Please note change in reference range.   Creatinine, Ser 0.82 0.44 - 1.00 mg/dL   Calcium 8.9 8.9 - 10.3 mg/dL   Total Protein 6.3 (L) 6.5 - 8.1 g/dL   Albumin 3.9 3.5 - 5.0 g/dL   AST 26 15 - 41 U/L   ALT 24 0 - 44 U/L    Comment: Please note change in reference range.   Alkaline Phosphatase 41 38 - 126 U/L   Total Bilirubin 0.5 0.3 - 1.2 mg/dL   GFR calc non Af Amer >60 >60 mL/min   GFR calc Af Amer >60 >60 mL/min    Comment: (NOTE) The eGFR has been calculated using the CKD EPI equation. This calculation has not been validated in all clinical situations. eGFR's persistently <60 mL/min signify possible Chronic Kidney Disease.    Anion gap 7 5 - 15    Comment: Performed at [facility] [address] [place] [address]  CBC     Status: None   Collection Time: 01/12/18 11:05 PM  Result Value Ref Range   WBC 7.5 4.0 - 10.5 K/uL   RBC 4.40 3.87 - 5.11 MIL/uL   Hemoglobin 13.5 12.0 - 15.0 g/dL   HCT 41.0 36.0 - 46.0 %   MCV 93.2 78.0 - 100.0 fL   MCH 30.7 26.0 - 34.0 pg   MCHC 32.9 30.0 - 36.0 g/dL   RDW 12.5 11.5 - 15.5 %   Platelets 249 150 - 400 K/uL    Comment: Performed at [facility], [place] [address], [place], [place] [address]  I-Stat beta hCG blood, ED     Status: None   Collection Time: 01/12/18 11:22 PM  Result Value Ref Range   I-stat hCG, quantitative <5.0 <5 mIU/mL   Comment 3  Comment:   GEST. AGE      CONC.  (mIU/mL)   <=1 WEEK        5 - 50     2 WEEKS       50 - 500     3  WEEKS       100 - 10,000     4 WEEKS     1,000 - 30,000        FEMALE AND NON-PREGNANT FEMALE:     LESS THAN 5 mIU/mL    Ct Abdomen Pelvis W Contrast  Result Date: 01/13/2018 CLINICAL DATA:  Abdominal pain EXAM: CT ABDOMEN AND PELVIS WITH CONTRAST TECHNIQUE: Multidetector CT imaging of the abdomen and pelvis was performed using the standard protocol following bolus administration of intravenous contrast. CONTRAST:  172m OMNIPAQUE IOHEXOL 300 MG/ML  SOLN COMPARISON:  None. FINDINGS: LOWER CHEST: No basilar pulmonary nodules or pleural effusion. No apical pericardial effusion. HEPATOBILIARY: Normal hepatic contours and density. No intra- or extrahepatic biliary dilatation. Normal gallbladder. PANCREAS: Normal parenchymal contours without ductal dilatation. No peripancreatic fluid collection. SPLEEN: Normal. ADRENALS/URINARY TRACT: --Adrenal glands: Normal. --Right kidney/ureter: No hydronephrosis, nephroureterolithiasis, perinephric stranding or solid renal mass. --Left kidney/ureter: No hydronephrosis, nephroureterolithiasis, perinephric stranding or solid renal mass. --Urinary bladder: Normal for degree of distention STOMACH/BOWEL: --Stomach/Duodenum: No hiatal hernia or other gastric abnormality. Normal duodenal course. --Small bowel: No dilatation or inflammation. --Colon: There is swirling mesenteric vasculature in the right lower quadrant, best demonstrated on coronal images 20-29. The cecum is twisted and dilated, pointing toward the left upper quadrant. --Appendix: Not clearly visualized. VASCULAR/LYMPHATIC: Normal course and caliber of the major abdominal vessels. No abdominal or pelvic lymphadenopathy. REPRODUCTIVE: No free fluid in the pelvis. MUSCULOSKELETAL. No bony spinal canal stenosis or focal osseous abnormality. OTHER: None. IMPRESSION: Twisted mesenteric vasculature in the right lower quadrant with dilated, upturned cecum, consistent with cecal volvulus. These results were called by  telephone at the time of interpretation on 01/13/2018 at 4:32 am to [facility], who verbally acknowledged these results. Electronically Signed   By: KUlyses JarredM.D.   On: 01/13/2018 04:33    Review of Systems  Constitutional: Negative for weight loss.  HENT: Negative for ear discharge, ear pain, hearing loss and tinnitus.   Eyes: Negative for blurred vision, double vision, photophobia and pain.  Respiratory: Negative for cough, sputum production and shortness of breath.   Cardiovascular: Negative for chest pain.  Gastrointestinal: Positive for abdominal pain, constipation and nausea. Negative for vomiting.  Genitourinary: Negative for dysuria, flank pain, frequency and urgency.  Musculoskeletal: Negative for back pain, falls, joint pain, myalgias and neck pain.  Neurological: Negative for dizziness, tingling, sensory change, focal weakness, loss of consciousness and headaches.  Endo/Heme/Allergies: Does not bruise/bleed easily.  Psychiatric/Behavioral: Negative for depression, memory loss and substance abuse. The patient is not nervous/anxious.     Blood pressure (!) 106/49, pulse 66, temperature 98.6 F (37 C), temperature source Oral, resp. rate 16, height '5\' 9"'  (1.753 m), weight 59.9 kg (132 lb), last menstrual period 01/29/2011, SpO2 100 %, currently breastfeeding. 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  To OR for immediate exploratory laparotomy,  possible right hemicolectomy, possible ileostomy.  The surgical procedure has been discussed with the patient.  Potential risks, benefits, alternative treatments, and expected outcomes have been explained.  All of the patient's questions at this time have been answered.  The likelihood of reaching the patient's treatment goal is good.  The patient understands the proposed surgical procedure and wishes to proceed.    Maia Petties, MD 01/13/2018, 6:01 AM

## 2018-01-13 NOTE — ED Notes (Signed)
Taken to short stay 34 at this time.

## 2018-01-13 NOTE — ED Notes (Signed)
Taken to CT at this time. 

## 2018-01-13 NOTE — ED Notes (Signed)
Back from CT.  Assisted to ambulate to the bathroom.  Pain noted to be increased.  Refusing pain meds at this time.  States I want to wait for my results.

## 2018-01-13 NOTE — ED Provider Notes (Signed)
[facility] EMERGENCY DEPARTMENT Provider Note   CSN: [id number] Arrival date & time: 01/12/18  2250     History   Chief Complaint Chief Complaint  Patient presents with  . Abdominal Pain    HPI Madeline Walker is a 47 y.o. female.  Patient presents to the emergency department with a chief complaint of right lower quadrant abdominal pain.  She states that earlier today she began having some generalized abdominal discomfort.  She states that she felt bloated.  She tried taking some Metamucil but had worsening of her symptoms.  She denies any fevers or chills.  Denies vomiting, or diarrhea.  Prior abdominal surgeries include cesarean section and tummy tuck.  The history is provided by the patient. No language interpreter was used.    Past Medical History:  Diagnosis Date  . BPPV (benign paroxysmal positional vertigo)    [facility]  . Breast cyst   . Cancer ([place])    Melanoma  . Chest pain   . Fibrocystic breast   . History of chicken pox   . Infection    HX OF OCCASIONAL UTI  . Infertility, female   . Ovarian cyst   . Palpitations   . Premature ovarian failure   . Yeast infection     Patient Active Problem List   Diagnosis Date Noted  . Migraine without aura and without status migrainosus, not intractable 06/12/2017  . Palpitations 02/26/2014  . Dizziness 02/26/2014  . Umbilical hernia 89/37/3428  . Mastitis, associated with childbirth 02/21/2012  . Post partum depression 12/06/2011    Past Surgical History:  Procedure Laterality Date  . ABDOMINOPLASTY  2015  . CESAREAN SECTION    . CESAREAN SECTION  10/31/2011   Procedure: CESAREAN SECTION;  Surgeon: Eldred Manges, MD;  Location: [facility];  Service: Gynecology;  Laterality: N/A;  . DILATION AND CURETTAGE OF UTERUS  2010   SAB  . RHINOPLASTY       OB History    Gravida  4   Para  2   Term  2   Preterm  0   AB  2   Living  2     SAB  2   TAB  0   Ectopic  0   Multiple  0   Live Births  2            Home Medications    Prior to Admission medications   Medication Sig Start Date End Date Taking? Authorizing Provider  estradiol (VIVELLE-DOT) 0.05 MG/24HR patch Place 1 patch onto the skin every 3 (three) days. 11/09/17  Yes [provider]  Estradiol (YUVAFEM) 10 MCG TABS vaginal tablet Place 1 tablet vaginally 2 (two) times a week. For 5 weeks   Yes [provider]  nortriptyline (PAMELOR) 10 MG capsule Take 1 capsule (10 mg total) by mouth at bedtime. 06/12/17  Yes Melvenia Beam, MD  progesterone (PROMETRIUM) 100 MG capsule Take 200 mg by mouth at bedtime. 05/18/17  Yes [provider]  rizatriptan (MAXALT) 10 MG tablet Take 1 tablet (10 mg total) by mouth as needed for migraine. May repeat in 2 hours if needed Patient not taking: Reported on 01/13/2018 06/12/17   Melvenia Beam, MD    Family History Family History  Problem Relation Age of Onset  . COPD Mother   . Mental illness Mother        DEPRESSION AND SUICIDE  . Migraines Mother   . Hypertension Father   .  Cancer Father        KIDNEY AND PROSTATE  . Anemia Sister   . Autism Son   . Cancer Maternal Uncle        LUNG  . Rheum arthritis Maternal Grandmother   . Diabetes Maternal Grandfather        TYPE I  . Cancer Paternal Grandfather        colon    Social History Social History   Tobacco Use  . Smoking status: Never Smoker  . Smokeless tobacco: Never Used  Substance Use Topics  . Alcohol use: Yes    Alcohol/week: 0.6 oz    Types: 1 Standard drinks or equivalent per week    Comment: week  . Drug use: No     Allergies   Patient has no known allergies.   Review of Systems Review of Systems  All other systems reviewed and are negative.    Physical Exam Updated Vital Signs BP (!) 105/49   Pulse 65   Temp 98.6 F (37 C) (Oral)   Resp 16   Ht 5\' 9"  (1.753 m)   Wt 59.9 kg (132 lb)   LMP 01/29/2011   SpO2 100%   BMI  19.49 kg/m   Physical Exam  Constitutional: She is oriented to person, place, and time. She appears well-developed and well-nourished.  HENT:  Head: Normocephalic and atraumatic.  Eyes: Pupils are equal, round, and reactive to light. Conjunctivae and EOM are normal.  Neck: Normal range of motion. Neck supple.  Cardiovascular: Normal rate and regular rhythm. Exam reveals no gallop and no friction rub.  No murmur heard. Pulmonary/Chest: Effort normal and breath sounds normal. No respiratory distress. She has no wheezes. She has no rales. She exhibits no tenderness.  Abdominal: Soft. Bowel sounds are normal. She exhibits no distension and no mass. There is no tenderness. There is no rebound and no guarding.  Right lower quadrant tenderness  Musculoskeletal: Normal range of motion. She exhibits no edema or tenderness.  Neurological: She is alert and oriented to person, place, and time.  Skin: Skin is warm and dry.  Psychiatric: She has a normal mood and affect. Her behavior is normal. Judgment and thought content normal.  Nursing note and vitals reviewed.    ED Treatments / Results  Labs (all labs ordered are listed, but only abnormal results are displayed) Labs Reviewed  LIPASE, BLOOD - Abnormal; Notable for the following components:      Result Value   Lipase 58 (*)    All other components within normal limits  COMPREHENSIVE METABOLIC PANEL - Abnormal; Notable for the following components:   Total Protein 6.3 (*)    All other components within normal limits  URINALYSIS, ROUTINE W REFLEX MICROSCOPIC - Abnormal; Notable for the following components:   APPearance TURBID (*)    Leukocytes, UA LARGE (*)    WBC, UA >50 (*)    Bacteria, UA MANY (*)    All other components within normal limits  CBC  I-STAT BETA HCG BLOOD, ED ([facility], [facility], [facility] ONLY)    EKG None  Radiology Ct Abdomen Pelvis W Contrast  Result Date: 01/13/2018 CLINICAL DATA:  Abdominal pain EXAM: CT ABDOMEN AND PELVIS  WITH CONTRAST TECHNIQUE: Multidetector CT imaging of the abdomen and pelvis was performed using the standard protocol following bolus administration of intravenous contrast. CONTRAST:  166mL OMNIPAQUE IOHEXOL 300 MG/ML  SOLN COMPARISON:  None. FINDINGS: LOWER CHEST: No basilar pulmonary nodules or pleural effusion. No apical pericardial  effusion. HEPATOBILIARY: Normal hepatic contours and density. No intra- or extrahepatic biliary dilatation. Normal gallbladder. PANCREAS: Normal parenchymal contours without ductal dilatation. No peripancreatic fluid collection. SPLEEN: Normal. ADRENALS/URINARY TRACT: --Adrenal glands: Normal. --Right kidney/ureter: No hydronephrosis, nephroureterolithiasis, perinephric stranding or solid renal mass. --Left kidney/ureter: No hydronephrosis, nephroureterolithiasis, perinephric stranding or solid renal mass. --Urinary bladder: Normal for degree of distention STOMACH/BOWEL: --Stomach/Duodenum: No hiatal hernia or other gastric abnormality. Normal duodenal course. --Small bowel: No dilatation or inflammation. --Colon: There is swirling mesenteric vasculature in the right lower quadrant, best demonstrated on coronal images 20-29. The cecum is twisted and dilated, pointing toward the left upper quadrant. --Appendix: Not clearly visualized. VASCULAR/LYMPHATIC: Normal course and caliber of the major abdominal vessels. No abdominal or pelvic lymphadenopathy. REPRODUCTIVE: No free fluid in the pelvis. MUSCULOSKELETAL. No bony spinal canal stenosis or focal osseous abnormality. OTHER: None. IMPRESSION: Twisted mesenteric vasculature in the right lower quadrant with dilated, upturned cecum, consistent with cecal volvulus. These results were called by telephone at the time of interpretation on 01/13/2018 at 4:32 am to [facility] , who verbally acknowledged these results. Electronically Signed   By: Ulyses Jarred M.D.   On: 01/13/2018 04:33    Procedures Procedures (including critical care  time)  Medications Ordered in ED Medications  iohexol (OMNIPAQUE) 300 MG/ML solution 100 mL (has no administration in time range)     Initial Impression / Assessment and Plan / ED Course  I have reviewed the triage vital signs and the nursing notes.  Pertinent labs & imaging results that were available during my care of the patient were reviewed by me and considered in my medical decision making (see chart for details).  Clinical Course as of Jan 13 522  Mon Jan 13, 2018  0243 Budding Yeast: PRESENT [RH]  [phone] CT ABDOMEN PELVIS W CONTRAST [RH]    Clinical Course User Index [RH] Lowella Curb   Patient with right lower quadrant abdominal pain.  Symptoms started earlier today.  They were more generalized and have now migrated to the right lower quadrant.  She is quite tender to palpation.  Will check CT to rule out appendicitis.  5:24 AM Patient discussed with Dr. Georgette Dover.  Patient will be admitted by Dr. Georgette Dover or a colleague.  Final Clinical Impressions(s) / ED Diagnoses   Final diagnoses:  Cecal volvulus [facility])    ED Discharge Orders    None       Montine Circle, PA-C 01/13/18 0524    Fatima Blank, MD 01/14/18 [phone]

## 2018-01-14 ENCOUNTER — Encounter (HOSPITAL_COMMUNITY): Payer: Self-pay | Admitting: Surgery

## 2018-01-14 LAB — CBC
HCT: 34.4 % — ABNORMAL LOW (ref 36.0–46.0)
Hemoglobin: 11.3 g/dL — ABNORMAL LOW (ref 12.0–15.0)
MCH: 31 pg (ref 26.0–34.0)
MCHC: 32.8 g/dL (ref 30.0–36.0)
MCV: 94.5 fL (ref 78.0–100.0)
PLATELETS: 190 10*3/uL (ref 150–400)
RBC: 3.64 MIL/uL — ABNORMAL LOW (ref 3.87–5.11)
RDW: 12.5 % (ref 11.5–15.5)
WBC: 10.1 10*3/uL (ref 4.0–10.5)

## 2018-01-14 LAB — BASIC METABOLIC PANEL
Anion gap: 6 (ref 5–15)
BUN: 7 mg/dL (ref 6–20)
CHLORIDE: 106 mmol/L (ref 98–111)
CO2: 25 mmol/L (ref 22–32)
CREATININE: 0.71 mg/dL (ref 0.44–1.00)
Calcium: 8.2 mg/dL — ABNORMAL LOW (ref 8.9–10.3)
GFR calc Af Amer: >60 mL/min (ref >60)
Glucose, Bld: 101 mg/dL — ABNORMAL HIGH (ref 70–99)
Potassium: 3.8 mmol/L (ref 3.5–5.1)
SODIUM: 137 mmol/L (ref 135–145)

## 2018-01-14 MED ORDER — SODIUM CHLORIDE 0.9 % IV SOLN: INTRAVENOUS | Status: DC

## 2018-01-14 MED ORDER — SIMETHICONE 80 MG PO CHEW
80.0000 mg | CHEWABLE_TABLET | Freq: Four times a day (QID) | ORAL | Status: DC | PRN
  Administered 2018-01-14: 80 mg via ORAL
  Filled 2018-01-14: qty 1

## 2018-01-14 MED ORDER — CODEINE SULFATE 15 MG PO TABS
15.0000 mg | ORAL_TABLET | ORAL | Status: DC | PRN
  Administered 2018-01-14: 15 mg via ORAL
  Filled 2018-01-14: qty 1

## 2018-01-14 MED ORDER — OXYCODONE HCL 5 MG PO TABS
5.0000 mg | ORAL_TABLET | ORAL | Status: DC | PRN
  Administered 2018-01-14 – 2018-01-15 (×3): 5 mg via ORAL
  Filled 2018-01-14 (×4): qty 1

## 2018-01-14 MED ORDER — KCL IN DEXTROSE-NACL 20-5-0.9 MEQ/L-%-% IV SOLN
INTRAVENOUS | Status: DC
  Administered 2018-01-14: 20:00:00 via INTRAVENOUS
  Administered 2018-01-14: 100 mL/h via INTRAVENOUS
  Administered 2018-01-15 – 2018-01-16 (×3): via INTRAVENOUS
  Filled 2018-01-14 (×8): qty 1000

## 2018-01-14 MED ORDER — ACETAMINOPHEN 325 MG PO TABS
650.0000 mg | ORAL_TABLET | Freq: Four times a day (QID) | ORAL | Status: DC
Start: 2018-01-14 — End: 2018-01-16
  Administered 2018-01-14 – 2018-01-16 (×10): 650 mg via ORAL
  Filled 2018-01-14 (×10): qty 2

## 2018-01-14 MED ORDER — METHOCARBAMOL 500 MG PO TABS
500.0000 mg | ORAL_TABLET | Freq: Four times a day (QID) | ORAL | Status: DC
  Administered 2018-01-14 – 2018-01-16 (×10): 500 mg via ORAL
  Filled 2018-01-14 (×10): qty 1

## 2018-01-14 NOTE — Progress Notes (Signed)
Received orders for Simethicone for c/o gas.  Pt verbalizes pain relief, appropriate sized abdominal binder now in place per MD order.  No acute distress noted.  [id number]

## 2018-01-14 NOTE — Progress Notes (Signed)
1 Day Post-Op    CC: Right-sided abdominal pain and distention  Subjective: She looks pretty good this morning.  Sore and fairly anxious about all that has occurred.  She is also worried about taking the pain medicine and prolonging her stay in the hospital.  She says she is a gym rat and she is worried about not getting back to the gym.  She also has a 47-year-old and a 47 year old at home, so she is worried about the kids, especially the 60-year-old.  She had some trouble voiding postop but that seems to have resolved.  Her incision looks good, she is not distended, she has a few bowel sounds and is tolerating clears.  Objective: Vital signs in last 24 hours: Temp:  [98 F (36.7 C)-98.9 F (37.2 C)] 98.3 F (36.8 C) (07/16 0450) Pulse Rate:  [65-89] 65 (07/16 0450) Resp:  [12-19] 16 (07/16 0450) BP: (93-113)/(39-58) 106/47 (07/16 0450) SpO2:  [94 %-100 %] 99 % (07/16 0450) Last BM Date: 01/12/18 100 p.o. 2000 IV 270 urine recorded Afebrile vital signs are stable A.m. labs shows a normal creatinine of 0.71.   Potassium of 3.8. WBC 10.1 H/H =  11.3/34.4 Urinalysis on admission showed more than 50 white cells per high-powered field and many bacteria. Admission CT 7/15:Twisted mesenteric vasculature in the right lower quadrant with dilated, upturned cecum, consistent with cecal volvulus.  Intake/Output from previous day: 07/15 0701 - 07/16 0700 In: 2100 [P.O.:100; I.V.:1000; IV Piggyback:1000] Out: 295 [Urine:270; Blood:25] Intake/Output this shift: No intake/output data recorded.  General appearance: alert, cooperative and no distress Resp: clear to auscultation bilaterally GI: Soft, pretty sore, incision with a waffle dressing looks good.  She has a few bowel sounds, no flatus or BM so far.  Lab Results:  Recent Labs    01/12/18 2305 01/14/18 0509  WBC 7.5 10.1  HGB 13.5 11.3*  HCT 41.0 34.4*  PLT 249 190    BMET Recent Labs    01/12/18 2305 01/14/18 0509  NA  139 137  K 4.0 3.8  CL 106 106  CO2 26 25  GLUCOSE 99 101*  BUN 16 7  CREATININE 0.82 0.71  CALCIUM 8.9 8.2*   PT/INR No results for input(s): LABPROT, INR in the last 72 hours.  Recent Labs  Lab 01/12/18 2305  AST 26  ALT 24  ALKPHOS 41  BILITOT 0.5  PROT 6.3*  ALBUMIN 3.9     Lipase     Component Value Date/Time   LIPASE 58 (H) 01/12/2018 2305     Prior to Admission medications   Medication Sig Start Date End Date Taking? Authorizing Provider  estradiol (VIVELLE-DOT) 0.05 MG/24HR patch Place 1 patch onto the skin every 3 (three) days. 11/09/17  Yes [provider]  Estradiol (YUVAFEM) 10 MCG TABS vaginal tablet Place 1 tablet vaginally 2 (two) times a week. For 5 weeks   Yes [provider]  nortriptyline (PAMELOR) 10 MG capsule Take 1 capsule (10 mg total) by mouth at bedtime. 06/12/17  Yes Melvenia Beam, MD  progesterone (PROMETRIUM) 100 MG capsule Take 200 mg by mouth at bedtime. 05/18/17  Yes [provider]  rizatriptan (MAXALT) 10 MG tablet Take 1 tablet (10 mg total) by mouth as needed for migraine. May repeat in 2 hours if needed Patient not taking: Reported on 01/13/2018 06/12/17   Melvenia Beam, MD    Medications: . enoxaparin (LOVENOX) injection  40 mg Subcutaneous Q24H  . estradiol  0.05 mg  Transdermal Q7 days  . nortriptyline  10 mg Oral QHS  . progesterone  200 mg Oral QHS   . sodium chloride 100 mL/hr at 01/13/18 2331   Anti-infectives (From admission, onward)   Start     Dose/Rate Route Frequency Ordered Stop   01/13/18 2100  cefoTEtan (CEFOTAN) 2 g in sodium chloride 0.9 % 100 mL IVPB     2 g 200 mL/hr over 30 Minutes Intravenous  Once 01/13/18 2049 01/14/18 0054   01/13/18 1800  cefoTEtan (CEFOTAN) 2 g in sodium chloride 0.9 % 100 mL IVPB  Status:  Discontinued     2 g 200 mL/hr over 30 Minutes Intravenous Every 12 hours 01/13/18 1204 01/13/18 2143   01/13/18 0600  cefTRIAXone (ROCEPHIN) 1 g in sodium chloride  0.9 % 100 mL IVPB     1 g 200 mL/hr over 30 Minutes Intravenous  Once 01/13/18 0559 01/13/18 0634     Assessment/Plan Hx of Migraine - Nortriptyline Hx of melanoma Hx of benign proximal positional vertigo  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 Donnie Mesa  FEN: IV fluids/clear liquids ID: Rocephin x1 dose 7/15; cefotetan 2 g IV 7/15 - 7/16 DVT: Lovenox-SCDs Follow-up: Dr. Georgette Dover Foley: None   Plan: We will keep her on clears and IV fluids for now.  We will begin to mobilize her.  She was switched from morphine to Dilaudid yesterday.  I have put her on Tylenol 650 mg every 6 hours, she has codeine 15 to 30 mg p.o. every 4 hours as needed, and Robaxin 500 mg p.o. 6H.  We will add some potassium to her IV fluids, next bag.    LOS: 1 day    Mareesa Gathright 01/14/2018 [phone]

## 2018-01-15 MED ORDER — KETOROLAC TROMETHAMINE 15 MG/ML IJ SOLN
15.0000 mg | Freq: Four times a day (QID) | INTRAMUSCULAR | Status: DC
  Administered 2018-01-15 – 2018-01-16 (×4): 15 mg via INTRAVENOUS
  Filled 2018-01-15 (×4): qty 1

## 2018-01-15 NOTE — Plan of Care (Signed)
  Problem: Pain Managment: Goal: General experience of comfort will improve Outcome: Progressing   Problem: Safety: Goal: Ability to remain free from injury will improve Outcome: Progressing   Problem: Skin Integrity: Goal: Risk for impaired skin integrity will decrease Outcome: Progressing   

## 2018-01-15 NOTE — Progress Notes (Signed)
2 Days Post-Op    CC: Right-sided abdominal pain and distention  Subjective: She is restless and wants to be out of here.  She does not like the clear liquid choices she has been given.  She has had no flatus, BS are present, a little hyperactive.     Objective: Vital signs in last 24 hours: Temp:  [98.2 F (36.8 C)-99.2 F (37.3 C)] 98.2 F (36.8 C) (07/17 0650) Pulse Rate:  [63-79] 75 (07/17 0650) Resp:  [16-18] 18 (07/17 0650) BP: (106-114)/(36-52) 110/51 (07/17 0650) SpO2:  [100 %] 100 % (07/17 0650) Last BM Date: 01/12/18 840 Po 2100 IV Urine x 5  Afebrile vital signs are stable. Labs today  Intake/Output from previous day: 07/16 0701 - 07/17 0700 In: 2972.9 [P.O.:840; I.V.:2132.9] Out: -  Intake/Output this shift: No intake/output data recorded.  General appearance: alert, cooperative, no distress and walking constantly.   Resp: clear to auscultation bilaterally GI: Midline incision looks fine.,  Bowels are slightly hyperactive.  No flatus or BM so far.  Lab Results:  Recent Labs    01/12/18 2305 01/14/18 0509  WBC 7.5 10.1  HGB 13.5 11.3*  HCT 41.0 34.4*  PLT 249 190    BMET Recent Labs    01/12/18 2305 01/14/18 0509  NA 139 137  K 4.0 3.8  CL 106 106  CO2 26 25  GLUCOSE 99 101*  BUN 16 7  CREATININE 0.82 0.71  CALCIUM 8.9 8.2*   PT/INR No results for input(s): LABPROT, INR in the last 72 hours.  Recent Labs  Lab 01/12/18 2305  AST 26  ALT 24  ALKPHOS 41  BILITOT 0.5  PROT 6.3*  ALBUMIN 3.9     Lipase     Component Value Date/Time   LIPASE 58 (H) 01/12/2018 2305     Medications: . acetaminophen  650 mg Oral Q6H  . enoxaparin (LOVENOX) injection  40 mg Subcutaneous Q24H  . estradiol  0.05 mg Transdermal Q7 days  . methocarbamol  500 mg Oral Q6H  . nortriptyline  10 mg Oral QHS  . progesterone  200 mg Oral QHS   . sodium chloride    . 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 Donnie Mesa POD #2  FEN: IV fluids/clear liquids ID: Rocephin x1 dose 7/15; cefotetan 2 g IV 7/15 - 7/16 DVT: Lovenox-SCDs Follow-up: Dr. Georgette Dover Foley: None   Plan: I have given her some full liquids, but told her to go slow.  I assured her she is getting plenty of fluids through her IVs, I was not worried about dehydration.  I was worried she would vomit she took in too much p.o. too soon.  Added IV Toradol to help with the pain.  Recheck labs in a.m.         LOS: 2 days    Madeline Walker 01/15/2018 [phone]

## 2018-01-16 LAB — CBC
HCT: 34.9 % — ABNORMAL LOW (ref 36.0–46.0)
HEMOGLOBIN: 11.5 g/dL — AB (ref 12.0–15.0)
MCH: 31.1 pg (ref 26.0–34.0)
MCHC: 33 g/dL (ref 30.0–36.0)
MCV: 94.3 fL (ref 78.0–100.0)
PLATELETS: 212 10*3/uL (ref 150–400)
RBC: 3.7 MIL/uL — AB (ref 3.87–5.11)
RDW: 12.4 % (ref 11.5–15.5)
WBC: 7.6 10*3/uL (ref 4.0–10.5)

## 2018-01-16 LAB — BASIC METABOLIC PANEL
ANION GAP: 6 (ref 5–15)
CHLORIDE: 109 mmol/L (ref 98–111)
CO2: 26 mmol/L (ref 22–32)
Calcium: 8.6 mg/dL — ABNORMAL LOW (ref 8.9–10.3)
Creatinine, Ser: 0.71 mg/dL (ref 0.44–1.00)
GFR calc Af Amer: >60 mL/min (ref >60)
Glucose, Bld: 101 mg/dL — ABNORMAL HIGH (ref 70–99)
POTASSIUM: 3.9 mmol/L (ref 3.5–5.1)
SODIUM: 141 mmol/L (ref 135–145)

## 2018-01-16 MED ORDER — IBUPROFEN 200 MG PO TABS: ORAL_TABLET | ORAL | Status: DC

## 2018-01-16 MED ORDER — ACETAMINOPHEN 325 MG PO TABS: ORAL_TABLET | ORAL | Status: DC

## 2018-01-16 MED ORDER — METHOCARBAMOL 500 MG PO TABS: 500.0000 mg | ORAL_TABLET | Freq: Three times a day (TID) | ORAL | 0 refills | Status: AC | PRN

## 2018-01-16 MED ORDER — IBUPROFEN 600 MG PO TABS: 600.0000 mg | ORAL_TABLET | Freq: Four times a day (QID) | ORAL | Status: DC | PRN

## 2018-01-16 MED ORDER — OXYCODONE HCL 5 MG PO TABS: ORAL_TABLET | ORAL | 0 refills | Status: DC

## 2018-01-16 NOTE — Progress Notes (Signed)
3 Days Post-Op    CC: Right-sided abdominal pain and distention  Subjective: She is doing well had some stools this AM.  Tolerating full liquids well.  Wants to move on and get out of the hospital.    Objective: Vital signs in last 24 hours: Temp:  [98.4 F (36.9 C)-98.6 F (37 C)] 98.4 F (36.9 C) (07/18 0520) Pulse Rate:  [67-75] 74 (07/18 0520) Resp:  [16] 16 (07/18 0520) BP: (115-117)/(49-65) 117/65 (07/18 0520) SpO2:  [99 %-100 %] 99 % (07/18 0520) Last BM Date: 01/12/18 480 p.o. 1095 IV Voided x3 Afebrile vital signs are stable Labs are essentially normal.  Intake/Output from previous day: 07/17 0701 - 07/18 0700 In: 1575 [P.O.:480; I.V.:1095] Out: -  Intake/Output this shift: No intake/output data recorded.  General appearance: alert, cooperative and no distress Resp: clear to auscultation bilaterally GI: soft, sore, incision looks good.  Few BS, + BM  Lab Results:  Recent Labs    01/14/18 0509 01/16/18 0552  WBC 10.1 7.6  HGB 11.3* 11.5*  HCT 34.4* 34.9*  PLT 190 212    BMET Recent Labs    01/14/18 0509 01/16/18 0552  NA 137 141  K 3.8 3.9  CL 106 109  CO2 25 26  GLUCOSE 101* 101*  BUN 7 <5*  CREATININE 0.71 0.71  CALCIUM 8.2* 8.6*   PT/INR No results for input(s): LABPROT, INR in the last 72 hours.  Recent Labs  Lab 01/12/18 2305  AST 26  ALT 24  ALKPHOS 41  BILITOT 0.5  PROT 6.3*  ALBUMIN 3.9     Lipase     Component Value Date/Time   LIPASE 58 (H) 01/12/2018 2305     Medications: . acetaminophen  650 mg Oral Q6H  . enoxaparin (LOVENOX) injection  40 mg Subcutaneous Q24H  . estradiol  0.05 mg Transdermal Q7 days  . ketorolac  15 mg Intravenous Q6H  . methocarbamol  500 mg Oral Q6H  . nortriptyline  10 mg Oral QHS  . progesterone  200 mg Oral QHS    Assessment/Plan Hx of Migraine - Nortriptyline Hx of melanoma Hx of benign proximal positional vertigo  Cecal volvulus with very long redundant right and transverse  colon Exploratory laparotomy and extended right hemicolectomy, 01/13/2018, Dr. Donnie Mesa POD #3  FEN: IV fluids/full liquids ID: Rocephin x1 dose 7/15; cefotetan 2 g IV 7/15 - 7/16 DVT: Lovenox-SCDs Follow-up: Dr. Georgette Dover Foley: None  Plan: We will advance her diet to soft and if she continues to do well and aim for discharge tomorrow.  We will arrange to get her staples out in the office and follow-up with Dr. Halford Chessman in 2 weeks.        LOS: 3 days    Madeline Walker 01/16/2018 [phone]

## 2018-01-16 NOTE — Plan of Care (Signed)
  Problem: Education: Goal: Knowledge of General Education information will improve Outcome: Progressing   Problem: Clinical Measurements: Goal: Will remain free from infection Outcome: Progressing Goal: Diagnostic test results will improve Outcome: Progressing   Problem: Coping: Goal: Level of anxiety will decrease Outcome: Progressing   Problem: Pain Managment: Goal: General experience of comfort will improve Outcome: Progressing

## 2018-01-16 NOTE — Discharge Instructions (Signed)
[facility]      [facility] °[phone] ° °OPEN ABDOMINAL SURGERY: POST OP INSTRUCTIONS ° °Always review your discharge instruction sheet given to you by the facility where your surgery was performed. ° °IF YOU HAVE DISABILITY OR FAMILY LEAVE FORMS, YOU MUST BRING THEM TO THE OFFICE FOR PROCESSING.  PLEASE DO NOT GIVE THEM TO YOUR DOCTOR. ° °1. A prescription for pain medication may be given to you upon discharge.  Take your pain medication as prescribed, if needed.  If narcotic pain medicine is not needed, then you may take acetaminophen (Tylenol) or ibuprofen (Advil) as needed. °2. Take your usually prescribed medications unless otherwise directed. °3. If you need a refill on your pain medication, please contact your pharmacy. They will contact our office to request authorization.  Prescriptions will not be filled after 5pm or on week-ends. °4. You should follow a light diet the first few days after arrival home, such as soup and crackers, pudding, etc.unless your doctor has advised otherwise. A high-fiber, low fat diet can be resumed as tolerated.   Be sure to include lots of fluids daily. Most patients will experience some swelling and bruising on the chest and neck area.  Ice packs will help.  Swelling and bruising can take several days to resolve °5. Most patients will experience some swelling and bruising in the area of the incision. Ice pack will help. Swelling and bruising can take several days to resolve..  °6. It is common to experience some constipation if taking pain medication after surgery.  Increasing fluid intake and taking a stool softener will usually help or prevent this problem from occurring.  A mild laxative (Milk of Magnesia or Miralax) should be taken according to package directions if there are no bowel movements after 48 hours. °7.  You may have steri-strips (small skin tapes) in place directly over the incision.  These strips should be left on the skin for 7-10 days.  If your  surgeon used skin glue on the incision, you may shower in 24 hours.  The glue will flake off over the next 2-3 weeks.  Any sutures or staples will be removed at the office during your follow-up visit. You may find that a light gauze bandage over your incision may keep your staples from being rubbed or pulled. You may shower and replace the bandage daily. °8. ACTIVITIES:  You may resume regular (light) daily activities beginning the next day--such as daily self-care, walking, climbing stairs--gradually increasing activities as tolerated.  You may have sexual intercourse when it is comfortable.  Refrain from any heavy lifting or straining until approved by your doctor. °a. You may drive when you no longer are taking prescription pain medication, you can comfortably wear a seatbelt, and you can safely maneuver your car and apply brakes °b. Return to Work: ___________________________________ °9. You should see your doctor in the office for a follow-up appointment approximately two weeks after your surgery.  Make sure that you call for this appointment within a day or two after you arrive home to insure a convenient appointment time. °OTHER INSTRUCTIONS:  °_____________________________________________________________ °_____________________________________________________________ ° °WHEN TO CALL YOUR DOCTOR: °1. Fever over 101.0 °2. Inability to urinate °3. Nausea and/or vomiting °4. Extreme swelling or bruising °5. Continued bleeding from incision. °6. Increased pain, redness, or drainage from the incision. °7. Difficulty swallowing or breathing °8. Muscle cramping or spasms. °9. Numbness or tingling in hands or feet or around lips. ° °The clinic staff is available to   answer your questions during regular business hours.  Please dont hesitate to call and ask to speak to one of the nurses if you have concerns.  For further questions, please visit [email or web address]  Volvulus You twisting was in your Cecum   Volvulus is an abnormal twisting of a portion of your digestive tract. Your digestive tract consists of your swallowing tube (esophagus), followed by your stomach, small intestine, and large intestine. This twisting can block the flow of digestion (intestinal obstruction). It can also block the blood flow to the part of the digestive tract that is twisted. Lack of blood flow can cause the twisted part of the digestive tract to die. Volvulus is a medical emergency. There are various types of volvulus:  Sigmoid volvulus is a twisting of the last part of your large intestine. This is the most common type.  Midgut volvulus usually occurs in children who are born with an abnormally positioned small intestine (malrotation).  Cecal volvulus may be caused by scar tissue from previous abdominal surgery.  Gastric volvulus is a rare type of volvulus that occurs when the stomach twists around itself.  What are the causes? Volvulus may be caused by many different things. It can be something you are born with (congenital deformity), or it may be a problem that develops from another condition. What increases the risk? You may have a greater risk of volvulus if you:  Are 30 years of age or older.  Have long-standing (chronic) constipation.  Have part of your stomach located above the area where the stomach and esophagus meet (paraesophageal hernia).  Are bedridden.  Have had previous abdominal surgery.  Live in a long-term care facility.  What are the signs or symptoms? Signs and symptoms of most types of volvulus include:  Abdominal pain. ? Sigmoid volvulus may cause pain in the lower left part of the abdomen. ? Cecal volvulus may cause pain in the lower right part of the abdomen. ? Gastric and midgut volvulus may cause pain in the upper abdomen.  Bloating and swelling of the abdomen.  Decreased passing of gas or inability to pass gas.  Nausea.  Vomiting.  Constipation.  Tenderness  when pressing on the abdomen.  As the condition gets worse, the volvulus can develop a hole (perforation) and leak digestive contents into the abdomen. This can cause late symptoms of volvulus, including:  Severe infection (sepsis).  Bleeding into the abdomen.  Very low blood pressure (shock).  How is this diagnosed? Your health care provider may suspect volvulus if you have sudden signs and symptoms of intestinal obstruction. A physical exam will be done. The health care provider will listen to your abdomen for the sounds of digestion and will feel your abdomen for tenderness. Imaging studies of your abdomen may also be done. These may include:  CT scan. This is the best imaging study for diagnosing volvulus.  Plain X-rays. These may show air and fluid levels and widening above the obstruction.  Ultrasound.  How is this treated? Volvulus is almost always a medical emergency requiring immediate surgery. A surgeon may attempt to do a procedure to untwist the volvulus if possible. If the volvulus cannot be untwisted, the part of the digestive tract involved may need to be removed. This information is not intended to replace advice given to you by your health care provider. Make sure you discuss any questions you have with your health care provider. Document Released: 03/13/2001 Document Revised: 11/24/2015 Document Reviewed: 03/03/2014 Elsevier Interactive Patient Education  2018 [place].

## 2018-01-16 NOTE — Discharge Summary (Signed)
Physician Discharge Summary  Patient ID: Madeline Walker MRN: [id number] DOB/AGE: Aug 10, 1970 47 y.o.  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 Bertram Gala Course:   47 yo female with history of chronic constipation and sigmoid diverticulosis presents with 24 hour of acute lower abdominal pain, distention, and nausea.  This began after a normal bowel movement yesterday morning.  The pain and distention became worse during the day, so she presented to the ED for evaluation.  Appendicitis was suspected, but CT scan showed cecal volvulus.   She had an abdominoplasty/ umbilical hernia repair by Dr. Daleen Snook in 2015.  No mesh in place. Colonoscopy in 2018 by Dr. Collene Mares reportedly showed sigmoid diverticulosis.    Patient was seen in the ED by Dr. Georgette Dover and taken the operating room.  The patient was found to have a very distended redundant transverse colon.  The area of the volvulus was was localized, and the patient underwent extended right hemicolectomy.  Patient was transferred to the floor in satisfactory condition postop During her postop.  She has been anxious to go home from the first postoperative day.  Her diet was advanced.  She started having bowel movements on 7/17.  By 7/17 she was taking a soft diet and was Dr. Dois Davenport opinion she can be discharged home.  She has follow-up appointments for staple removal and to see Dr. Georgette Dover.  CBC Latest Ref Rng & Units 01/16/2018 01/14/2018 01/12/2018  WBC  4.0 - 10.5 K/uL 7.6 10.1 7.5  Hemoglobin 12.0 - 15.0 g/dL 11.5(L) 11.3(L) 13.5  Hematocrit 36.0 - 46.0 % 34.9(L) 34.4(L) 41.0  Platelets 150 - 400 K/uL 212 190 249   CMP Latest Ref Rng & Units 01/16/2018 01/14/2018 01/12/2018  Glucose 70 - 99 mg/dL 101(H) 101(H) 99  BUN 6 - 20 mg/dL <5(L) 7 16  Creatinine 0.44 - 1.00 mg/dL 0.71 0.71 0.82  Sodium 135 - 145 mmol/L 141 137 139  Potassium 3.5 - 5.1 mmol/L 3.9 3.8 4.0  Chloride 98 - 111 mmol/L 109 106 106  CO2 22 - 32 mmol/L 26 25 26   Calcium 8.9 - 10.3 mg/dL 8.6(L) 8.2(L) 8.9  Total Protein 6.5 - 8.1 g/dL - - 6.3(L)  Total Bilirubin 0.3 - 1.2 mg/dL - - 0.5  Alkaline Phos 38 - 126 U/L - - 41  AST 15 - 41 U/L - - 26  ALT 0 - 44 U/L - - 24    Condition on discharge: Improving   Disposition: Home   Allergies as of 01/16/2018   No Known Allergies     Medication List    TAKE these medications   acetaminophen 325 MG tablet Commonly known as:  TYLENOL You can take 2 tablets every 4 hours as needed for pain.  This should be your first pain medicine.  If you still need more pain relief you can alternate with Ibuprofen and/or Oxycodone.    You can buy this drug at any drug store over the counter.  Do not take more than 4000 mg of Tylenol per day, it can harm your liver.  estradiol 0.05 MG/24HR patch Commonly known as:  VIVELLE-DOT Place 1 patch onto the skin every 3 (three) days.   ibuprofen 200 MG tablet Commonly known as:  ADVIL,MOTRIN You can take 2-3 tablets every 6 hours as needed for pain.  You can alternate this with the plain tylenol, or the oxycodone. This should be your second choice for pain control.  You can buy this over the counter at any drug store.   methocarbamol 500 MG tablet Commonly known as:  ROBAXIN Take 1 tablet (500 mg total) by mouth every 8 (eight) hours as needed for up to 5 days for muscle spasms.   nortriptyline 10 MG capsule Commonly known as:  PAMELOR Take 1 capsule (10 mg total) by mouth at bedtime.    oxyCODONE 5 MG immediate release tablet Commonly known as:  Oxy IR/ROXICODONE You can take 1 tablet every 6 hours as needed for pain.  This should be your last resort for pain relief.  You can take this if the Tylenol/ibuprofen are ineffective.   progesterone 100 MG capsule Commonly known as:  PROMETRIUM Take 200 mg by mouth at bedtime.   rizatriptan 10 MG tablet Commonly known as:  MAXALT Take 1 tablet (10 mg total) by mouth as needed for migraine. May repeat in 2 hours if needed   YUVAFEM 10 MCG Tabs vaginal tablet Generic drug:  Estradiol Place 1 tablet vaginally 2 (two) times a week. For 5 weeks      Follow-up Information    [facility] Follow up on 01/23/2018.   Specialty:  General Surgery Why:  you will see the nurse to remove your staples.  Your appointment is at 3:30 PM.  Be at the office 30 minutes early for check in.  Bring photo ID and insurance information.   Contact information: [address] [place] [address] [phone]        Donnie Mesa, MD Follow up on 02/03/2018.   Specialty:  General Surgery Why:  your appointment is at 9:50 AM.  Be at the office 30 minute early for check in.  Bring photo ID and insurance information.   Contact information: [address] [place] [address] [phone]        Leighton Ruff, MD Follow up.   Specialty:  Family Medicine Why:  Call and let Dr. Drema Dallas know you have had surgery and follow up for Medical issue she has been following for you. Contact information: [place] [address] [phone]           Signed: Earnstine Regal 01/16/2018, 2:51 PM

## 2018-01-30 DIAGNOSIS — K5730 Diverticulosis of large intestine without perforation or abscess without bleeding: Secondary | ICD-10-CM | POA: Diagnosis not present

## 2018-01-30 DIAGNOSIS — R194 Change in bowel habit: Secondary | ICD-10-CM | POA: Diagnosis not present

## 2018-01-30 DIAGNOSIS — Z800 Family history of malignant neoplasm of digestive organs: Secondary | ICD-10-CM | POA: Diagnosis not present

## 2018-04-01 DIAGNOSIS — E039 Hypothyroidism, unspecified: Secondary | ICD-10-CM | POA: Diagnosis not present

## 2018-04-01 DIAGNOSIS — E559 Vitamin D deficiency, unspecified: Secondary | ICD-10-CM | POA: Diagnosis not present

## 2018-04-01 DIAGNOSIS — N959 Unspecified menopausal and perimenopausal disorder: Secondary | ICD-10-CM | POA: Diagnosis not present

## 2018-04-01 DIAGNOSIS — E539 Vitamin B deficiency, unspecified: Secondary | ICD-10-CM | POA: Diagnosis not present

## 2018-04-01 DIAGNOSIS — R5383 Other fatigue: Secondary | ICD-10-CM | POA: Diagnosis not present

## 2018-04-16 DIAGNOSIS — N9489 Other specified conditions associated with female genital organs and menstrual cycle: Secondary | ICD-10-CM | POA: Diagnosis not present

## 2018-04-16 DIAGNOSIS — R102 Pelvic and perineal pain: Secondary | ICD-10-CM | POA: Diagnosis not present

## 2018-04-21 DIAGNOSIS — Z86008 Personal history of in-situ neoplasm of other site: Secondary | ICD-10-CM | POA: Diagnosis not present

## 2018-04-21 DIAGNOSIS — L570 Actinic keratosis: Secondary | ICD-10-CM | POA: Diagnosis not present

## 2018-04-21 DIAGNOSIS — Z808 Family history of malignant neoplasm of other organs or systems: Secondary | ICD-10-CM | POA: Diagnosis not present

## 2018-04-21 DIAGNOSIS — Z86018 Personal history of other benign neoplasm: Secondary | ICD-10-CM | POA: Diagnosis not present

## 2018-04-21 DIAGNOSIS — D225 Melanocytic nevi of trunk: Secondary | ICD-10-CM | POA: Diagnosis not present

## 2018-04-22 DIAGNOSIS — Z9049 Acquired absence of other specified parts of digestive tract: Secondary | ICD-10-CM | POA: Diagnosis not present

## 2018-04-22 DIAGNOSIS — N9489 Other specified conditions associated with female genital organs and menstrual cycle: Secondary | ICD-10-CM | POA: Diagnosis not present

## 2018-04-22 DIAGNOSIS — N951 Menopausal and female climacteric states: Secondary | ICD-10-CM | POA: Diagnosis not present

## 2018-04-22 DIAGNOSIS — R748 Abnormal levels of other serum enzymes: Secondary | ICD-10-CM | POA: Diagnosis not present

## 2018-05-05 DIAGNOSIS — Z124 Encounter for screening for malignant neoplasm of cervix: Secondary | ICD-10-CM | POA: Diagnosis not present

## 2018-05-05 DIAGNOSIS — Z1231 Encounter for screening mammogram for malignant neoplasm of breast: Secondary | ICD-10-CM | POA: Diagnosis not present

## 2018-05-05 DIAGNOSIS — Z6820 Body mass index (BMI) 20.0-20.9, adult: Secondary | ICD-10-CM | POA: Diagnosis not present

## 2018-05-05 DIAGNOSIS — Z01419 Encounter for gynecological examination (general) (routine) without abnormal findings: Secondary | ICD-10-CM | POA: Diagnosis not present

## 2018-05-13 DIAGNOSIS — R748 Abnormal levels of other serum enzymes: Secondary | ICD-10-CM | POA: Diagnosis not present

## 2018-06-02 ENCOUNTER — Ambulatory Visit: Payer: Self-pay | Admitting: Surgery

## 2018-06-02 DIAGNOSIS — K432 Incisional hernia without obstruction or gangrene: Secondary | ICD-10-CM | POA: Diagnosis not present

## 2018-06-02 NOTE — H&P (View-Only) (Signed)
History of Present Illness Imogene Burn. Jeanne Diefendorf MD; 06/02/2018 11:07 AM) The patient is a 47 year old female who presents with an incisional hernia. PCP is Dr. Leighton Ruff GI - Dr. Collene Mares This is follow-up after emergency surgery on 01/13/18.  This is a 47 year old female with a history of chronic constipation sigmoid diverticulosis who presented to the emergency department on 01/13/18 with acute lower abdominal pain distention and nausea. CT scan showed cecal volvulus. Because of the patient's intense abdominal pain we proceeded directly to the operating room. There was no sign of perforation or gross infection. The patient had a very distended redundant transverse colon. She did have a cecal volvulus caused by a very thick adhesive band constricting the ascending colon just above the cecum. We divided the band and release the volvulus. The colon appeared mildly ischemic but this improved after releasing the volvulus. The patient had a very long redundant colon and had virtually no lateral attachments of the right colon. We made the decision to perform an extended right hemicolectomy with an ileocolic anastomosis. The patient still has a fairly significant length of distal transverse, descending, and sigmoid colon. She probably has what would be considered an average normal length of colon. The patient did well and was discharged home on 01/16/18.  Since being home, the patient reports that her bowel movements have improved significantly. She has normal formed but soft bowel movements daily and sometimes more than once a day. She has resumed her usual vigorous workout regimen. Over the last several weeks, she has noticed some bulging at her umbilicus after working out. This causes some discomfort. She denies any obstructive symptoms. The bulge is reducible. She presents now for evaluation of possible incisional hernia.   Problem List/Past Medical Rodman Key K. 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 Jatara Huettner, MD; 06/02/2018 11:07 AM) Cesarean Section - Multiple Colon Removal - Partial Resection of Small Bowel Ventral / Umbilical Hernia Surgery Left.  Diagnostic Studies History Imogene Burn. Karma Hiney, MD; 06/02/2018 11:07 AM) Colonoscopy 1-5 years ago Mammogram 1-3 years ago  Allergies Emeline Gins, CMA; 06/02/2018 10:26 AM) No Known Drug Allergies [02/11/2018]: Allergies Reconciled  Medication History Emeline Gins, CMA; 06/02/2018 10:27 AM) Testosterone Enanthate (200MG /ML Solution, Injection) Active. Tylenol (325MG  Tablet, Oral) Active. Estradiol (0.05MG /24HR Patch TW, Transdermal) Active. Estradiol (10MCG Tablet, Vaginal) Active. Nortriptyline HCl (10MG  Capsule, Oral) Active. Progesterone Micronized (100MG  Capsule, Oral) Active. Rizatriptan Benzoate (10MG  Tablet, Oral) Active. Medications Reconciled  Social History Imogene Burn. Diamonique Ruedas, MD; 06/02/2018 11:07 AM) Alcohol use Occasional alcohol use. Caffeine use Coffee. No drug use Tobacco use Never smoker.  Family History Imogene Burn. Shomari Scicchitano, MD; 06/02/2018 11:07 AM) Alcohol Abuse Mother. Cerebrovascular Accident Father. Colon Polyps Sister. Depression Sister. Hypertension Father. Migraine Headache Mother. Prostate Cancer Father.  Pregnancy / Birth History Imogene Burn. Tessia Kassin, MD; 06/02/2018 11:07 AM) Age at menarche 43 years. Age of menopause <45 Contraceptive History Oral contraceptives. Gravida 4 Length (months) of breastfeeding 7-12 Maternal age 70-30 Para 2  Other Problems Imogene Burn. Casi Westerfeld, MD; 06/02/2018 11:07 AM) Diverticulosis Melanoma Migraine Headache Umbilical Hernia Repair    Vitals Emeline Gins CMA; 06/02/2018 10:25 AM) 06/02/2018 10:25 AM Weight: 139 lb Height: 69in Body Surface Area:  1.77 m Body Mass Index: 20.53 kg/m  Temp.: 98.41F  Pulse: 110 (Regular)  BP: 122/70 (Sitting, Left Arm, Standard)      Physical Exam Rodman Key K.  Rodderick Holtzer MD; 06/02/2018 11:08 AM)  The physical exam findings are as follows: Note:WDWN in NAD Eyes: Pupils equal, round; sclera anicteric HENT: Oral mucosa moist; good dentition Neck: No masses palpated, no thyromegaly Lungs: CTA bilaterally; normal respiratory effort CV: Regular rate and rhythm; no murmurs; extremities well-perfused with no edema Abd: +bowel sounds, soft, non-tender, no palpable organomegaly; Healed midline incision. At the umbilicus, there seems to be a protruding umbilical ventral incisional hernia. This is reducible. The defect seems to be less than 2 cm across. No sign of infection. Skin: Warm, dry; no sign of jaundice Psychiatric - alert and oriented x 4; calm mood and affect    Assessment & Plan Rodman Key K. Herberto Ledwell MD; 06/02/2018 10:49 AM)  VENTRAL INCISIONAL HERNIA WITHOUT OBSTRUCTION OR GANGRENE (K43.2)  Current Plans Schedule for Surgery - Ventral incisional hernia repair with mesh. The surgical procedure has been discussed with the patient. Potential risks, benefits, alternative treatments, and expected outcomes have been explained. All of the patient's questions at this time have been answered. The likelihood of reaching the patient's treatment goal is good. The patient understand the proposed surgical procedure and wishes to proceed.  Imogene Burn. Georgette Dover, MD, [facility]  General/ Trauma Surgery Beeper [phone]  06/02/2018 11:08 AM

## 2018-06-02 NOTE — H&P (Signed)
History of Present Illness Madeline Walker. Shalynn Jorstad MD; 06/02/2018 11:07 AM) The patient is a 47 year old female who presents with an incisional hernia. PCP is Dr. Leighton Ruff GI - Dr. Collene Mares This is follow-up after emergency surgery on 01/13/18.  This is a 47 year old female with a history of chronic constipation sigmoid diverticulosis who presented to the emergency department on 01/13/18 with acute lower abdominal pain distention and nausea. CT scan showed cecal volvulus. Because of the patient's intense abdominal pain we proceeded directly to the operating room. There was no sign of perforation or gross infection. The patient had a very distended redundant transverse colon. She did have a cecal volvulus caused by a very thick adhesive band constricting the ascending colon just above the cecum. We divided the band and release the volvulus. The colon appeared mildly ischemic but this improved after releasing the volvulus. The patient had a very long redundant colon and had virtually no lateral attachments of the right colon. We made the decision to perform an extended right hemicolectomy with an ileocolic anastomosis. The patient still has a fairly significant length of distal transverse, descending, and sigmoid colon. She probably has what would be considered an average normal length of colon. The patient did well and was discharged home on 01/16/18.  Since being home, the patient reports that her bowel movements have improved significantly. She has normal formed but soft bowel movements daily and sometimes more than once a day. She has resumed her usual vigorous workout regimen. Over the last several weeks, she has noticed some bulging at her umbilicus after working out. This causes some discomfort. She denies any obstructive symptoms. The bulge is reducible. She presents now for evaluation of possible incisional hernia.   Problem List/Past Medical Rodman Key K. 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 Erlene Devita, MD; 06/02/2018 11:07 AM) Cesarean Section - Multiple Colon Removal - Partial Resection of Small Bowel Ventral / Umbilical Hernia Surgery Left.  Diagnostic Studies History Madeline Walker. Diana Davenport, MD; 06/02/2018 11:07 AM) Colonoscopy 1-5 years ago Mammogram 1-3 years ago  Allergies Emeline Gins, CMA; 06/02/2018 10:26 AM) No Known Drug Allergies [02/11/2018]: Allergies Reconciled  Medication History Emeline Gins, CMA; 06/02/2018 10:27 AM) Testosterone Enanthate (200MG /ML Solution, Injection) Active. Tylenol (325MG  Tablet, Oral) Active. Estradiol (0.05MG /24HR Patch TW, Transdermal) Active. Estradiol (10MCG Tablet, Vaginal) Active. Nortriptyline HCl (10MG  Capsule, Oral) Active. Progesterone Micronized (100MG  Capsule, Oral) Active. Rizatriptan Benzoate (10MG  Tablet, Oral) Active. Medications Reconciled  Social History Madeline Walker. Cassondra Stachowski, MD; 06/02/2018 11:07 AM) Alcohol use Occasional alcohol use. Caffeine use Coffee. No drug use Tobacco use Never smoker.  Family History Madeline Walker. Yarden Manuelito, MD; 06/02/2018 11:07 AM) Alcohol Abuse Mother. Cerebrovascular Accident Father. Colon Polyps Sister. Depression Sister. Hypertension Father. Migraine Headache Mother. Prostate Cancer Father.  Pregnancy / Birth History Madeline Walker. Shizue Kaseman, MD; 06/02/2018 11:07 AM) Age at menarche 70 years. Age of menopause <45 Contraceptive History Oral contraceptives. Gravida 4 Length (months) of breastfeeding 7-12 Maternal age 71-30 Para 2  Other Problems Madeline Walker. Neiko Trivedi, MD; 06/02/2018 11:07 AM) Diverticulosis Melanoma Migraine Headache Umbilical Hernia Repair    Vitals Emeline Gins CMA; 06/02/2018 10:25 AM) 06/02/2018 10:25 AM Weight: 139 lb Height: 69in Body Surface Area:  1.77 m Body Mass Index: 20.53 kg/m  Temp.: 98.48F  Pulse: 110 (Regular)  BP: 122/70 (Sitting, Left Arm, Standard)      Physical Exam Rodman Key K.  Mayes Sangiovanni MD; 06/02/2018 11:08 AM)  The physical exam findings are as follows: Note:WDWN in NAD Eyes: Pupils equal, round; sclera anicteric HENT: Oral mucosa moist; good dentition Neck: No masses palpated, no thyromegaly Lungs: CTA bilaterally; normal respiratory effort CV: Regular rate and rhythm; no murmurs; extremities well-perfused with no edema Abd: +bowel sounds, soft, non-tender, no palpable organomegaly; Healed midline incision. At the umbilicus, there seems to be a protruding umbilical ventral incisional hernia. This is reducible. The defect seems to be less than 2 cm across. No sign of infection. Skin: Warm, dry; no sign of jaundice Psychiatric - alert and oriented x 4; calm mood and affect    Assessment & Plan Rodman Key K. Nandan Willems MD; 06/02/2018 10:49 AM)  VENTRAL INCISIONAL HERNIA WITHOUT OBSTRUCTION OR GANGRENE (K43.2)  Current Plans Schedule for Surgery - Ventral incisional hernia repair with mesh. The surgical procedure has been discussed with the patient. Potential risks, benefits, alternative treatments, and expected outcomes have been explained. All of the patient's questions at this time have been answered. The likelihood of reaching the patient's treatment goal is good. The patient understand the proposed surgical procedure and wishes to proceed.  Madeline Walker. Georgette Dover, MD, [facility]  General/ Trauma Surgery Beeper [phone]  06/02/2018 11:08 AM

## 2018-06-05 ENCOUNTER — Encounter (HOSPITAL_BASED_OUTPATIENT_CLINIC_OR_DEPARTMENT_OTHER): Payer: Self-pay | Admitting: *Deleted

## 2018-06-05 ENCOUNTER — Other Ambulatory Visit: Payer: Self-pay

## 2018-06-10 NOTE — Progress Notes (Signed)
Ensure pre surgery drink given with instructions to complete by 0415 dos, pt verbalized understanding. 

## 2018-06-13 ENCOUNTER — Encounter (HOSPITAL_BASED_OUTPATIENT_CLINIC_OR_DEPARTMENT_OTHER)
Admission: RE | Disposition: A | Discharge status: Home / Self Care | Payer: Self-pay | Source: Home / Self Care | Attending: Surgery

## 2018-06-13 ENCOUNTER — Ambulatory Visit (HOSPITAL_BASED_OUTPATIENT_CLINIC_OR_DEPARTMENT_OTHER): Payer: BLUE CROSS/BLUE SHIELD | Admitting: Certified Registered"

## 2018-06-13 ENCOUNTER — Ambulatory Visit (HOSPITAL_BASED_OUTPATIENT_CLINIC_OR_DEPARTMENT_OTHER)
Admission: RE | Admit: 2018-06-13 | Discharge: 2018-06-13 | Disposition: A | Discharge status: Home / Self Care | Payer: BLUE CROSS/BLUE SHIELD | Attending: Surgery | Admitting: Surgery

## 2018-06-13 ENCOUNTER — Encounter (HOSPITAL_BASED_OUTPATIENT_CLINIC_OR_DEPARTMENT_OTHER): Payer: Self-pay | Admitting: Certified Registered"

## 2018-06-13 ENCOUNTER — Other Ambulatory Visit: Payer: Self-pay

## 2018-06-13 DIAGNOSIS — Z9049 Acquired absence of other specified parts of digestive tract: Secondary | ICD-10-CM | POA: Diagnosis not present

## 2018-06-13 DIAGNOSIS — Z79899 Other long term (current) drug therapy: Secondary | ICD-10-CM | POA: Insufficient documentation

## 2018-06-13 DIAGNOSIS — K432 Incisional hernia without obstruction or gangrene: Secondary | ICD-10-CM | POA: Diagnosis not present

## 2018-06-13 DIAGNOSIS — Z79890 Hormone replacement therapy (postmenopausal): Secondary | ICD-10-CM | POA: Insufficient documentation

## 2018-06-13 HISTORY — PX: INCISIONAL HERNIA REPAIR: SHX193

## 2018-06-13 SURGERY — REPAIR, HERNIA, INCISIONAL
Anesthesia: General | Site: Abdomen

## 2018-06-13 MED ORDER — LIDOCAINE HCL (CARDIAC) PF 100 MG/5ML IV SOSY
PREFILLED_SYRINGE | INTRAVENOUS | Status: DC | PRN
  Administered 2018-06-13: 60 mg via INTRAVENOUS

## 2018-06-13 MED ORDER — MIDAZOLAM HCL 2 MG/2ML IJ SOLN
1.0000 mg | INTRAMUSCULAR | Status: DC | PRN
  Administered 2018-06-13: 2 mg via INTRAVENOUS

## 2018-06-13 MED ORDER — ONDANSETRON HCL 4 MG/2ML IJ SOLN
INTRAMUSCULAR | Status: DC | PRN
  Administered 2018-06-13: 4 mg via INTRAVENOUS

## 2018-06-13 MED ORDER — CEFAZOLIN SODIUM-DEXTROSE 2-4 GM/100ML-% IV SOLN
INTRAVENOUS | Status: AC
  Filled 2018-06-13: qty 100

## 2018-06-13 MED ORDER — CHLORHEXIDINE GLUCONATE CLOTH 2 % EX PADS: 6.0000 | MEDICATED_PAD | Freq: Once | CUTANEOUS | Status: DC

## 2018-06-13 MED ORDER — GABAPENTIN 300 MG PO CAPS
ORAL_CAPSULE | ORAL | Status: AC
  Filled 2018-06-13: qty 1

## 2018-06-13 MED ORDER — METHYLENE BLUE 0.5 % INJ SOLN
INTRAVENOUS | Status: AC
  Filled 2018-06-13: qty 10

## 2018-06-13 MED ORDER — SODIUM CHLORIDE (PF) 0.9 % IJ SOLN
INTRAMUSCULAR | Status: AC
  Filled 2018-06-13: qty 10

## 2018-06-13 MED ORDER — LIDOCAINE HCL (PF) 1 % IJ SOLN
INTRAMUSCULAR | Status: AC
  Filled 2018-06-13: qty 30

## 2018-06-13 MED ORDER — CELECOXIB 200 MG PO CAPS
200.0000 mg | ORAL_CAPSULE | ORAL | Status: AC
  Administered 2018-06-13: 200 mg via ORAL

## 2018-06-13 MED ORDER — PROPOFOL 10 MG/ML IV BOLUS
INTRAVENOUS | Status: DC | PRN
  Administered 2018-06-13: 200 mg via INTRAVENOUS

## 2018-06-13 MED ORDER — FENTANYL CITRATE (PF) 100 MCG/2ML IJ SOLN
INTRAMUSCULAR | Status: AC
  Filled 2018-06-13: qty 2

## 2018-06-13 MED ORDER — CELECOXIB 200 MG PO CAPS
ORAL_CAPSULE | ORAL | Status: AC
  Filled 2018-06-13: qty 1

## 2018-06-13 MED ORDER — FENTANYL CITRATE (PF) 100 MCG/2ML IJ SOLN: 25.0000 ug | INTRAMUSCULAR | Status: DC | PRN

## 2018-06-13 MED ORDER — DEXAMETHASONE SODIUM PHOSPHATE 4 MG/ML IJ SOLN
INTRAMUSCULAR | Status: DC | PRN
  Administered 2018-06-13: 10 mg via INTRAVENOUS

## 2018-06-13 MED ORDER — BUPIVACAINE LIPOSOME 1.3 % IJ SUSP
INTRAMUSCULAR | Status: DC | PRN
  Administered 2018-06-13: 10 mL

## 2018-06-13 MED ORDER — LACTATED RINGERS IV SOLN
INTRAVENOUS | Status: DC
  Administered 2018-06-13 (×2): via INTRAVENOUS

## 2018-06-13 MED ORDER — PHENYLEPHRINE HCL 10 MG/ML IJ SOLN
INTRAMUSCULAR | Status: DC | PRN
  Administered 2018-06-13 (×3): 40 ug via INTRAVENOUS

## 2018-06-13 MED ORDER — BUPIVACAINE-EPINEPHRINE (PF) 0.25% -1:200000 IJ SOLN
INTRAMUSCULAR | Status: AC
  Filled 2018-06-13: qty 30

## 2018-06-13 MED ORDER — FENTANYL CITRATE (PF) 100 MCG/2ML IJ SOLN
50.0000 ug | INTRAMUSCULAR | Status: DC | PRN
  Administered 2018-06-13 (×2): 50 ug via INTRAVENOUS

## 2018-06-13 MED ORDER — BUPIVACAINE-EPINEPHRINE 0.25% -1:200000 IJ SOLN
INTRAMUSCULAR | Status: DC | PRN
  Administered 2018-06-13: 10 mL

## 2018-06-13 MED ORDER — OXYCODONE HCL 5 MG PO TABS: 5.0000 mg | ORAL_TABLET | Freq: Four times a day (QID) | ORAL | 0 refills | Status: DC | PRN

## 2018-06-13 MED ORDER — GABAPENTIN 300 MG PO CAPS
300.0000 mg | ORAL_CAPSULE | ORAL | Status: AC
  Administered 2018-06-13: 300 mg via ORAL

## 2018-06-13 MED ORDER — MIDAZOLAM HCL 2 MG/2ML IJ SOLN
INTRAMUSCULAR | Status: AC
  Filled 2018-06-13: qty 2

## 2018-06-13 MED ORDER — CEFAZOLIN SODIUM-DEXTROSE 2-4 GM/100ML-% IV SOLN
2.0000 g | INTRAVENOUS | Status: AC
  Administered 2018-06-13: 2 g via INTRAVENOUS

## 2018-06-13 MED ORDER — ACETAMINOPHEN 500 MG PO TABS
1000.0000 mg | ORAL_TABLET | ORAL | Status: AC
  Administered 2018-06-13: 1000 mg via ORAL

## 2018-06-13 MED ORDER — BUPIVACAINE HCL (PF) 0.25 % IJ SOLN
INTRAMUSCULAR | Status: AC
  Filled 2018-06-13: qty 30

## 2018-06-13 MED ORDER — BUPIVACAINE HCL (PF) 0.5 % IJ SOLN
INTRAMUSCULAR | Status: AC
  Filled 2018-06-13: qty 30

## 2018-06-13 MED ORDER — SCOPOLAMINE 1 MG/3DAYS TD PT72: 1.0000 | MEDICATED_PATCH | Freq: Once | TRANSDERMAL | Status: DC | PRN

## 2018-06-13 MED ORDER — PROPOFOL 500 MG/50ML IV EMUL
INTRAVENOUS | Status: DC | PRN
  Administered 2018-06-13: 25 ug/kg/min via INTRAVENOUS
  Administered 2018-06-13: 1000 ug/kg/min via INTRAVENOUS

## 2018-06-13 MED ORDER — BUPIVACAINE LIPOSOME 1.3 % IJ SUSP
INTRAMUSCULAR | Status: AC
  Filled 2018-06-13: qty 20

## 2018-06-13 MED ORDER — ACETAMINOPHEN 500 MG PO TABS
ORAL_TABLET | ORAL | Status: AC
  Filled 2018-06-13: qty 2

## 2018-06-13 SURGICAL SUPPLY — 63 items
APL SKNCLS STERI-STRIP NONHPOA (GAUZE/BANDAGES/DRESSINGS) ×1
APL SWBSTK 6 STRL LF DISP (MISCELLANEOUS)
APPLICATOR COTTON TIP 6 STRL (MISCELLANEOUS) IMPLANT
APPLICATOR COTTON TIP 6IN STRL (MISCELLANEOUS)
BENZOIN TINCTURE PRP APPL 2/3 (GAUZE/BANDAGES/DRESSINGS) ×2 IMPLANT
BLADE CLIPPER SURG (BLADE) ×1 IMPLANT
BLADE HEX COATED 2.75 (ELECTRODE) ×2 IMPLANT
BLADE SURG 15 STRL LF DISP TIS (BLADE) ×1 IMPLANT
BLADE SURG 15 STRL SS (BLADE) ×2
CANISTER SUCT 1200ML W/VALVE (MISCELLANEOUS) IMPLANT
CHLORAPREP W/TINT 26ML (MISCELLANEOUS) ×2 IMPLANT
COVER BACK TABLE 60X90IN (DRAPES) ×2 IMPLANT
COVER MAYO STAND STRL (DRAPES) ×2 IMPLANT
COVER WAND RF STERILE (DRAPES) IMPLANT
DECANTER SPIKE VIAL GLASS SM (MISCELLANEOUS) IMPLANT
DRAPE LAPAROTOMY T 102X78X121 (DRAPES) ×2 IMPLANT
DRAPE UTILITY XL STRL (DRAPES) ×2 IMPLANT
DRSG TEGADERM 4X4.75 (GAUZE/BANDAGES/DRESSINGS) ×2 IMPLANT
ELECT REM PT RETURN 9FT ADLT (ELECTROSURGICAL) ×2
ELECTRODE REM PT RTRN 9FT ADLT (ELECTROSURGICAL) ×1 IMPLANT
GAUZE SPONGE 4X4 12PLY STRL LF (GAUZE/BANDAGES/DRESSINGS) IMPLANT
GLOVE BIO SURGEON STRL SZ 6.5 (GLOVE) ×1 IMPLANT
GLOVE BIO SURGEON STRL SZ7 (GLOVE) ×2 IMPLANT
GLOVE BIOGEL PI IND STRL 7.0 (GLOVE) IMPLANT
GLOVE BIOGEL PI IND STRL 7.5 (GLOVE) ×1 IMPLANT
GLOVE BIOGEL PI INDICATOR 7.0 (GLOVE) ×1
GLOVE BIOGEL PI INDICATOR 7.5 (GLOVE) ×1
GOWN STRL REUS W/ TWL LRG LVL3 (GOWN DISPOSABLE) ×2 IMPLANT
GOWN STRL REUS W/TWL LRG LVL3 (GOWN DISPOSABLE) ×4
MESH VENTRALEX 3.5 CRC LRG (Mesh General) IMPLANT
MESH VENTRALEX ST 8CM LRG (Mesh General) ×1 IMPLANT
NDL HYPO 25X1 1.5 SAFETY (NEEDLE) ×1 IMPLANT
NDL SAFETY ECLIPSE 18X1.5 (NEEDLE) IMPLANT
NEEDLE HYPO 18GX1.5 SHARP (NEEDLE)
NEEDLE HYPO 25X1 1.5 SAFETY (NEEDLE) ×2 IMPLANT
NS IRRIG 1000ML POUR BTL (IV SOLUTION) IMPLANT
PACK BASIN DAY SURGERY FS (CUSTOM PROCEDURE TRAY) ×2 IMPLANT
PENCIL BUTTON HOLSTER BLD 10FT (ELECTRODE) ×2 IMPLANT
SLEEVE SCD COMPRESS KNEE MED (MISCELLANEOUS) ×2 IMPLANT
SPONGE GAUZE 2X2 8PLY STRL LF (GAUZE/BANDAGES/DRESSINGS) ×2 IMPLANT
STAPLER VISISTAT 35W (STAPLE) IMPLANT
STRIP CLOSURE SKIN 1/2X4 (GAUZE/BANDAGES/DRESSINGS) ×2 IMPLANT
SUT MNCRL AB 4-0 PS2 18 (SUTURE) ×2 IMPLANT
SUT NOVA 0 T19/GS 22DT (SUTURE) ×2 IMPLANT
SUT NOVA NAB DX-16 0-1 5-0 T12 (SUTURE) ×1 IMPLANT
SUT NOVA NAB GS-21 1 T12 (SUTURE) IMPLANT
SUT PDS AB 0 CT 36 (SUTURE) IMPLANT
SUT PROLENE 0 CT 2 (SUTURE) IMPLANT
SUT SILK 3 0 TIES 17X18 (SUTURE)
SUT SILK 3-0 18XBRD TIE BLK (SUTURE) IMPLANT
SUT VIC AB 2-0 CT1 27 (SUTURE)
SUT VIC AB 2-0 CT1 TAPERPNT 27 (SUTURE) IMPLANT
SUT VIC AB 3-0 SH 27 (SUTURE) ×2
SUT VIC AB 3-0 SH 27X BRD (SUTURE) ×1 IMPLANT
SUT VIC AB 4-0 BRD 54 (SUTURE) IMPLANT
SUT VIC AB 4-0 SH 18 (SUTURE) IMPLANT
SUT VICRYL 4-0 PS2 18IN ABS (SUTURE) IMPLANT
SYR BULB 3OZ (MISCELLANEOUS) IMPLANT
SYR CONTROL 10ML LL (SYRINGE) ×2 IMPLANT
TOWEL GREEN STERILE FF (TOWEL DISPOSABLE) ×2 IMPLANT
TOWEL OR NON WOVEN STRL DISP B (DISPOSABLE) ×2 IMPLANT
TUBE CONNECTING 20X1/4 (TUBING) IMPLANT
YANKAUER SUCT BULB TIP NO VENT (SUCTIONS) IMPLANT

## 2018-06-13 NOTE — Anesthesia Preprocedure Evaluation (Addendum)
Anesthesia Evaluation  Patient identified by MRN, date of birth, ID band Patient awake    Reviewed: Allergy & Precautions, NPO status , Patient's Chart, lab work & pertinent test results  Airway Mallampati: II  TM Distance: >3 FB     Dental   Pulmonary neg pulmonary ROS,    breath sounds clear to auscultation       Cardiovascular negative cardio ROS   Rhythm:Regular Rate:Normal     Neuro/Psych  Headaches,    GI/Hepatic negative GI ROS, Neg liver ROS,   Endo/Other  negative endocrine ROS  Renal/GU negative Renal ROS     Musculoskeletal   Abdominal   Peds  Hematology   Anesthesia Other Findings   Reproductive/Obstetrics                             Anesthesia Physical Anesthesia Plan  ASA: II  Anesthesia Plan: General   Post-op Pain Management:    Induction: Intravenous  PONV Risk Score and Plan: Treatment may vary due to age or medical condition  Airway Management Planned: LMA  Additional Equipment:   Intra-op Plan:   Post-operative Plan: Extubation in OR  Informed Consent: I have reviewed the patients History and Physical, chart, labs and discussed the procedure including the risks, benefits and alternatives for the proposed anesthesia with the patient or authorized representative who has indicated his/her understanding and acceptance.   Dental advisory given  Plan Discussed with: CRNA and Anesthesiologist  Anesthesia Plan Comments:         Anesthesia Quick Evaluation

## 2018-06-13 NOTE — Interval H&P Note (Signed)
History and Physical Interval Note:  06/13/2018 7:12 AM  Madeline Walker  has presented today for surgery, with the diagnosis of Umbilical ventral incisional hernia  The various methods of treatment have been discussed with the patient and family. After consideration of risks, benefits and other options for treatment, the patient has consented to  Procedure(s): [place] (N/A) as a surgical intervention .  The patient's history has been reviewed, patient examined, no change in status, stable for surgery.  I have reviewed the patient's chart and labs.  Questions were answered to the patient's satisfaction.     Maia Petties

## 2018-06-13 NOTE — Discharge Instructions (Signed)
Information for Discharge Teaching: EXPAREL (bupivacaine liposome injectable suspension)   Your surgeon or anesthesiologist gave you EXPAREL(bupivacaine) to help control your pain after surgery.   EXPAREL is a local anesthetic that provides pain relief by numbing the tissue around the surgical site.  EXPAREL is designed to release pain medication over time and can control pain for up to 72 hours.  Depending on how you respond to EXPAREL, you may require less pain medication during your recovery.  Possible side effects:  Temporary loss of sensation or ability to move in the area where bupivacaine was injected.  Nausea, vomiting, constipation  Rarely, numbness and tingling in your mouth or lips, lightheadedness, or anxiety may occur.  Call your doctor right away if you think you may be experiencing any of these sensations, or if you have other questions regarding possible side effects.  Follow all other discharge instructions given to you by your surgeon or nurse. Eat a healthy diet and drink plenty of water or other fluids.  If you return to the hospital for any reason within 96 hours following the administration of EXPAREL, it is important for health care providers to know that you have received this anesthetic. A teal colored band has been placed on your arm with the date, time and amount of EXPAREL you have received in order to alert and inform your health care providers. Please leave this armband in place for the full 96 hours following administration, and then you may remove the band. Post Anesthesia Home Care Instructions  Activity: Get plenty of rest for the remainder of the day. A responsible individual must stay with you for 24 hours following the procedure.  For the next 24 hours, DO NOT: -Drive a car -Paediatric nurse -Drink alcoholic beverages -Take any medication unless instructed by your physician -Make any legal decisions or sign important papers.  Meals: Start with  liquid foods such as gelatin or soup. Progress to regular foods as tolerated. Avoid greasy, spicy, heavy foods. If nausea and/or vomiting occur, drink only clear liquids until the nausea and/or vomiting subsides. Call your physician if vomiting continues.  Special Instructions/Symptoms: Your throat may feel dry or sore from the anesthesia or the breathing tube placed in your throat during surgery. If this causes discomfort, gargle with warm salt water. The discomfort should disappear within 24 hours.  If you had a scopolamine patch placed behind your ear for the management of post- operative nausea and/or vomiting:  1. The medication in the patch is effective for 72 hours, after which it should be removed.  Wrap patch in a tissue and discard in the trash. Wash hands thoroughly with soap and water. 2. You may remove the patch earlier than 72 hours if you experience unpleasant side effects which may include dry mouth, dizziness or visual disturbances. 3. Avoid touching the patch. Wash your hands with soap and water after contact with the patch.  [facility] _______[facility]  HERNIA REPAIR: POST OP INSTRUCTIONS  Always review your discharge instruction sheet given to you by the facility where your surgery was performed. IF YOU HAVE DISABILITY OR FAMILY LEAVE FORMS, YOU MUST BRING THEM TO THE OFFICE FOR PROCESSING.   DO NOT GIVE THEM TO YOUR DOCTOR.  1. A  prescription for pain medication may be given to you upon discharge.  Take your pain medication as prescribed, if needed.  If narcotic pain medicine is not needed, then you may take acetaminophen (Tylenol) or ibuprofen (Advil) as needed. 2. Take  your usually prescribed medications unless otherwise directed. If you need a refill on your pain medication, please contact your pharmacy.  They will contact our office to request authorization. Prescriptions will not be filled after 5 pm or on week-ends. 3. You should follow a light diet the  first 24 hours after arrival home, such as soup and crackers, etc.  Be sure to include lots of fluids daily.  Resume your normal diet the day after surgery. 4.Most patients will experience some swelling and bruising around the umbilicus. Ice packs and reclining will help.  Swelling and bruising can take several days to resolve.  6. It is common to experience some constipation if taking pain medication after surgery.  Increasing fluid intake and taking a stool softener (such as Colace) will usually help or prevent this problem from occurring.  A mild laxative (Milk of Magnesia or Miralax) should be taken according to package directions if there are no bowel movements after 48 hours. 7. Unless discharge instructions indicate otherwise, you may remove your bandages 24-48 hours after surgery, and you may shower at that time.  You may have steri-strips (small skin tapes) in place directly over the incision.  These strips should be left on the skin for 7-10 days.  8. ACTIVITIES:  You may resume regular (light) daily activities beginning the next day--such as daily self-care, walking, climbing stairs--gradually increasing activities as tolerated.  You may have sexual intercourse when it is comfortable.  Refrain from any heavy lifting or straining until approved by your doctor.  a.You may drive when you are no longer taking prescription pain medication, you can comfortably wear a seatbelt, and you can safely maneuver your car and apply brakes. b.RETURN TO WORK:   _____________________________________________  9.You should see your doctor in the office for a follow-up appointment approximately 2-3 weeks after your surgery.  Make sure that you call for this appointment within a day or two after you arrive home to insure a convenient appointment time. 10.OTHER INSTRUCTIONS: _________________________    _____________________________________  WHEN TO CALL YOUR DOCTOR: 1. Fever over 101.0 2. Inability to  urinate 3. Nausea and/or vomiting 4. Extreme swelling or bruising 5. Continued bleeding from incision. 6. Increased pain, redness, or drainage from the incision  The clinic staff is available to answer your questions during regular business hours.  Please dont hesitate to call and ask to speak to one of the nurses for clinical concerns.  If you have a medical emergency, go to the nearest emergency room or call 911.  A surgeon from [facility] is always on call at the hospital   [address] [place], [place], [place]  [address] ?  [address], [place], [place]   [address] [phone] ? [phone] ? FAX [phone] Web site: [email or web address]

## 2018-06-13 NOTE — Anesthesia Postprocedure Evaluation (Signed)
Anesthesia Post Note  Patient: Madeline Walker  Procedure(s) Performed: VENTRAL INCISIONAL HERNIA REPAIR WITH  MESH ERAS PATHWAY (N/A Abdomen)     Patient location during evaluation: PACU Anesthesia Type: General Level of consciousness: awake Pain management: pain level controlled Vital Signs Assessment: post-procedure vital signs reviewed and stable Respiratory status: spontaneous breathing Postop Assessment: no headache Anesthetic complications: no    Last Vitals:  Vitals:   06/13/18 0900 06/13/18 0933  BP: 116/65 (!) 122/45  Pulse: (!) 106 (!) 101  Resp: 16 18  Temp:  36.6 C  SpO2: 100% 100%    Last Pain:  Vitals:   06/13/18 0900  TempSrc:   PainSc: 0-No pain                 Lacresia Darwish

## 2018-06-13 NOTE — Op Note (Signed)
Ventral Hernia Repair Procedure Note  Indications: Symptomatic ventral hernia - s/p colon surgery in July of this year.  The patient works out vigorously and has developed a small hernia at the upper end of her midline incision.  The hernia bulge measures about 4 cm across.  The patient has very little subcutaneous adipose tissue.  The bulge is easily visible.  She presents now for ventral hernia repair.  Pre-operative Diagnosis: Ventral incisional hernia  Post-operative Diagnosis: Ventral incisional hernia - defect 4 cm  Surgeon: Maia Petties   Assistants: none  Anesthesia: General LMA anesthesia and Exparel 20 ml  ASA Class: 1  Procedure Details  The patient was seen in the Holding Room. The risks, benefits, complications, treatment options, and expected outcomes were discussed with the patient. The possibilities of reaction to medication, pulmonary aspiration, perforation of viscus, bleeding, recurrent infection, the need for additional procedures, failure to diagnose a condition, and creating a complication requiring transfusion or operation were discussed with the patient. The patient concurred with the proposed plan, giving informed consent.  The site of surgery properly noted/marked. The patient was taken to the operating room, identified as Madeline Walker and the procedure verified as ventral hernia repair. A Time Out was held and the above information confirmed.  The patient was placed supine.  After establishing general anesthesia, the abdomen was prepped with Chloraprep and draped in sterile fashion.  We made a vertical incision over the palpable hernia around the umbilicus. Dissection was carried down to the hernia sac located above the fascia and mobilized from surrounding structures.  There were some omental adhesions, so we opened the hernia sac and dissected the adhesions away from the abdominal wall.  Intact fascia was identified circumferentially around the 4 cmdefect.  We  used a large ventral Lex mesh and secured this to the fascia with 8 interrupted 0 Novofil sutures.  We infiltrated 20 cc of Exparel into the fascia.  The fascial defect was reapproximated with interrupted figure-of-8 1 Novofil sutures.  The subcutaneous tissues were irrigated.   Hemostasis was confirmed.  The skin incision was closed in layers with a 3-0 Vicryl and 4-0 Monocryl subcuticular closure.  Steri-Strips were applied at the end of the operation.    Instrument, sponge, and needle counts were correct prior to closure and at the conclusion of the case.   Findings: 4 cm defect  Estimated Blood Loss:  Minimal         Drains: none                      Complications:  None; patient tolerated the procedure well.         Disposition: PACU - hemodynamically stable.         Condition: stable  Imogene Burn. Georgette Dover, MD, [facility]  General/ Trauma Surgery Beeper [phone]  06/13/2018 8:38 AM

## 2018-06-13 NOTE — Anesthesia Procedure Notes (Signed)
Procedure Name: LMA Insertion Date/Time: 06/13/2018 7:33 AM Performed by: Signe Colt, CRNA Pre-anesthesia Checklist: Patient identified, Emergency Drugs available, Suction available and Patient being monitored Patient Re-evaluated:Patient Re-evaluated prior to induction Oxygen Delivery Method: Circle system utilized Preoxygenation: Pre-oxygenation with 100% oxygen Induction Type: IV induction Ventilation: Mask ventilation without difficulty LMA: LMA inserted LMA Size: 4.0 Number of attempts: 1 Airway Equipment and Method: Bite block Placement Confirmation: positive ETCO2 Tube secured with: Tape Dental Injury: Teeth and Oropharynx as per pre-operative assessment

## 2018-06-13 NOTE — Transfer of Care (Signed)
Immediate Anesthesia Transfer of Care Note  Patient: Madeline Walker  Procedure(s) Performed: VENTRAL INCISIONAL HERNIA REPAIR WITH  MESH ERAS PATHWAY (N/A Abdomen)  Patient Location: PACU  Anesthesia Type:General  Level of Consciousness: awake, alert , oriented and patient cooperative  Airway & Oxygen Therapy: Patient Spontanous Breathing and Patient connected to face mask oxygen  Post-op Assessment: Report given to RN and Post -op Vital signs reviewed and stable  Post vital signs: Reviewed and stable  Last Vitals:  Vitals Value Taken Time  BP 126/96 06/13/2018  8:40 AM  Temp    Pulse 124 06/13/2018  8:41 AM  Resp 18 06/13/2018  8:41 AM  SpO2 100 % 06/13/2018  8:41 AM  Vitals shown include unvalidated device data.  Last Pain:  Vitals:   06/13/18 0651  TempSrc: Oral  PainSc: 0-No pain         Complications: No apparent anesthesia complications

## 2018-06-16 ENCOUNTER — Encounter (HOSPITAL_BASED_OUTPATIENT_CLINIC_OR_DEPARTMENT_OTHER): Payer: Self-pay | Admitting: Surgery

## 2018-06-16 NOTE — Addendum Note (Signed)
Addendum  created 06/16/18 0941 by Tawni Millers, CRNA   Charge Capture section accepted

## 2018-06-25 ENCOUNTER — Other Ambulatory Visit: Payer: Self-pay | Admitting: Neurology

## 2018-08-20 DIAGNOSIS — Z48816 Encounter for surgical aftercare following surgery on the genitourinary system: Secondary | ICD-10-CM | POA: Diagnosis not present

## 2018-08-29 ENCOUNTER — Other Ambulatory Visit: Payer: Self-pay | Admitting: Neurology

## 2018-08-29 ENCOUNTER — Encounter: Payer: Self-pay | Admitting: *Deleted

## 2018-08-29 NOTE — Telephone Encounter (Signed)
Will hold on refill until confirming if pt is lactating or not.

## 2018-10-27 DIAGNOSIS — M79669 Pain in unspecified lower leg: Secondary | ICD-10-CM | POA: Diagnosis not present

## 2018-10-30 DIAGNOSIS — D225 Melanocytic nevi of trunk: Secondary | ICD-10-CM | POA: Diagnosis not present

## 2018-10-30 DIAGNOSIS — L570 Actinic keratosis: Secondary | ICD-10-CM | POA: Diagnosis not present

## 2018-11-20 DIAGNOSIS — E039 Hypothyroidism, unspecified: Secondary | ICD-10-CM | POA: Diagnosis not present

## 2018-11-20 DIAGNOSIS — N952 Postmenopausal atrophic vaginitis: Secondary | ICD-10-CM | POA: Diagnosis not present

## 2018-11-20 DIAGNOSIS — E539 Vitamin B deficiency, unspecified: Secondary | ICD-10-CM | POA: Diagnosis not present

## 2018-11-20 DIAGNOSIS — R5383 Other fatigue: Secondary | ICD-10-CM | POA: Diagnosis not present

## 2018-11-27 DIAGNOSIS — H5213 Myopia, bilateral: Secondary | ICD-10-CM | POA: Diagnosis not present

## 2018-12-04 DIAGNOSIS — D225 Melanocytic nevi of trunk: Secondary | ICD-10-CM | POA: Diagnosis not present

## 2018-12-04 DIAGNOSIS — D229 Melanocytic nevi, unspecified: Secondary | ICD-10-CM | POA: Diagnosis not present

## 2018-12-04 DIAGNOSIS — L570 Actinic keratosis: Secondary | ICD-10-CM | POA: Diagnosis not present

## 2018-12-04 DIAGNOSIS — D485 Neoplasm of uncertain behavior of skin: Secondary | ICD-10-CM | POA: Diagnosis not present

## 2018-12-04 DIAGNOSIS — D235 Other benign neoplasm of skin of trunk: Secondary | ICD-10-CM | POA: Diagnosis not present

## 2018-12-08 DIAGNOSIS — F438 Other reactions to severe stress: Secondary | ICD-10-CM | POA: Diagnosis not present

## 2018-12-08 DIAGNOSIS — R51 Headache: Secondary | ICD-10-CM | POA: Diagnosis not present

## 2018-12-08 DIAGNOSIS — N951 Menopausal and female climacteric states: Secondary | ICD-10-CM | POA: Diagnosis not present

## 2018-12-08 DIAGNOSIS — E039 Hypothyroidism, unspecified: Secondary | ICD-10-CM | POA: Diagnosis not present

## 2019-03-23 DIAGNOSIS — Z7289 Other problems related to lifestyle: Secondary | ICD-10-CM | POA: Diagnosis not present

## 2019-03-23 DIAGNOSIS — K219 Gastro-esophageal reflux disease without esophagitis: Secondary | ICD-10-CM | POA: Diagnosis not present

## 2019-03-23 DIAGNOSIS — J342 Deviated nasal septum: Secondary | ICD-10-CM | POA: Diagnosis not present

## 2019-04-29 DIAGNOSIS — N951 Menopausal and female climacteric states: Secondary | ICD-10-CM | POA: Diagnosis not present

## 2019-05-01 DIAGNOSIS — D225 Melanocytic nevi of trunk: Secondary | ICD-10-CM | POA: Diagnosis not present

## 2019-05-01 DIAGNOSIS — Z86008 Personal history of in-situ neoplasm of other site: Secondary | ICD-10-CM | POA: Diagnosis not present

## 2019-05-01 DIAGNOSIS — Z86018 Personal history of other benign neoplasm: Secondary | ICD-10-CM | POA: Diagnosis not present

## 2019-05-01 DIAGNOSIS — Z808 Family history of malignant neoplasm of other organs or systems: Secondary | ICD-10-CM | POA: Diagnosis not present

## 2019-05-04 DIAGNOSIS — R590 Localized enlarged lymph nodes: Secondary | ICD-10-CM | POA: Diagnosis not present

## 2019-05-04 DIAGNOSIS — J040 Acute laryngitis: Secondary | ICD-10-CM | POA: Diagnosis not present

## 2019-05-12 DIAGNOSIS — R1013 Epigastric pain: Secondary | ICD-10-CM | POA: Diagnosis not present

## 2019-05-12 DIAGNOSIS — K219 Gastro-esophageal reflux disease without esophagitis: Secondary | ICD-10-CM | POA: Diagnosis not present

## 2019-05-12 DIAGNOSIS — K5730 Diverticulosis of large intestine without perforation or abscess without bleeding: Secondary | ICD-10-CM | POA: Diagnosis not present

## 2019-05-22 DIAGNOSIS — R350 Frequency of micturition: Secondary | ICD-10-CM | POA: Diagnosis not present

## 2019-05-22 DIAGNOSIS — R300 Dysuria: Secondary | ICD-10-CM | POA: Diagnosis not present

## 2019-06-20 ENCOUNTER — Other Ambulatory Visit: Payer: Self-pay | Admitting: Neurology

## 2019-06-22 ENCOUNTER — Encounter: Payer: Self-pay | Admitting: *Deleted

## 2019-06-22 NOTE — Telephone Encounter (Signed)
Send pt a message to clarify her dose. Also needs an appt.

## 2019-06-29 ENCOUNTER — Other Ambulatory Visit: Payer: Self-pay | Admitting: Neurology

## 2019-06-29 MED ORDER — NORTRIPTYLINE HCL 10 MG PO CAPS: 10.0000 mg | ORAL_CAPSULE | Freq: Every day | ORAL | 0 refills | Status: DC

## 2019-06-29 NOTE — Telephone Encounter (Signed)
Patient called in and stated she is taking 10mg  at bedtime , she has a fu scheduled for 01/12 with Amy.

## 2019-06-29 NOTE — Telephone Encounter (Signed)
Refill sent to pharmacy for the patient

## 2019-07-06 DIAGNOSIS — Z6820 Body mass index (BMI) 20.0-20.9, adult: Secondary | ICD-10-CM | POA: Diagnosis not present

## 2019-07-06 DIAGNOSIS — Z1231 Encounter for screening mammogram for malignant neoplasm of breast: Secondary | ICD-10-CM | POA: Diagnosis not present

## 2019-07-06 DIAGNOSIS — Z124 Encounter for screening for malignant neoplasm of cervix: Secondary | ICD-10-CM | POA: Diagnosis not present

## 2019-07-06 DIAGNOSIS — Z01419 Encounter for gynecological examination (general) (routine) without abnormal findings: Secondary | ICD-10-CM | POA: Diagnosis not present

## 2019-07-13 DIAGNOSIS — N951 Menopausal and female climacteric states: Secondary | ICD-10-CM | POA: Diagnosis not present

## 2019-07-13 DIAGNOSIS — E039 Hypothyroidism, unspecified: Secondary | ICD-10-CM | POA: Diagnosis not present

## 2019-07-14 ENCOUNTER — Encounter: Payer: Self-pay | Admitting: Family Medicine

## 2019-07-14 ENCOUNTER — Telehealth (INDEPENDENT_AMBULATORY_CARE_PROVIDER_SITE_OTHER): Payer: BC Managed Care – PPO | Admitting: Family Medicine

## 2019-07-14 DIAGNOSIS — R519 Headache, unspecified: Secondary | ICD-10-CM

## 2019-07-14 DIAGNOSIS — K219 Gastro-esophageal reflux disease without esophagitis: Secondary | ICD-10-CM | POA: Diagnosis not present

## 2019-07-14 DIAGNOSIS — G43009 Migraine without aura, not intractable, without status migrainosus: Secondary | ICD-10-CM | POA: Diagnosis not present

## 2019-07-14 MED ORDER — NORTRIPTYLINE HCL 10 MG PO CAPS: 10.0000 mg | ORAL_CAPSULE | Freq: Every day | ORAL | 3 refills | Status: DC

## 2019-07-14 MED ORDER — RIZATRIPTAN BENZOATE 10 MG PO TABS: ORAL_TABLET | ORAL | 11 refills | Status: DC

## 2019-07-14 NOTE — Progress Notes (Addendum)
PATIENT: Madeline Walker DOB: May 17, 1971  REASON FOR VISIT: follow up HISTORY FROM: patient  Virtual Visit via Telephone Note  I connected with Madeline Walker on 07/14/19 at 10:00 AM EST by telephone and verified that I am speaking with the correct person using two identifiers.   I discussed the limitations, risks, security and privacy concerns of performing an evaluation and management service by telephone and the availability of in person appointments. I also discussed with the patient that there may be a patient responsible charge related to this service. The patient expressed understanding and agreed to proceed.   History of Present Illness:  07/14/19 Madeline Walker is a 49 y.o. female here today for follow up for migraines. She was doing very well on nortriptyline 10mg  daily. In December, she stopped medication as she was uncertain that it was helping. Within 3 days, headaches had returned and more intense. She resumed 10mg  daily and reports that headaches have resolved. She does note increased acid reflux over the past 6 months. She also reports a tenderness of the left side of her head over the past month. No clear etiology. Pain is improving.   History (copied from Dr Cathren Laine note on 08/12/2017)  Interval history 08/12/2017:  Patient here for follow up of migraines. Reviewed MRI brain images today with patient, was normal. No aura. She is doing exceptionally well, no headaches, doing great on Nortriptyline, Reviewed images in detail with patient of her brain, answered ann questions, follow up in one year or sooner if necessary. Email with any non-urgent issues.  ROS: no headaches, endorses stress otherwise negative  HPI:  Madeline Walker is a 49 y.o. female here as a referral from Dr. Drema Dallas for past medical history of benign positional vertigo, stress and anxiety, chest pain and palpitations, anxiety. She was started on Topiramate. New headache started 6 months ago. It is on the  left side of her head. She also feels it in the back of her head. Usually over her left eye. Worse positionally when bending over for example, feels like pressure like her head was going to "blow", bending over would start the headache and last the whole day. Sometimes it is dull, sometimes it throbs so much she feels it in her neck, she stopped taking OTC meds, advil did not help. She cannot tolerate Topiramate. She can have related nausea, sound sensitivity - sound can worsen the headache. Movement makes it worse. No vomiting. Quiet dark rooms will help. Mother with migraines. She has daily headaches. All are migrainous. Severity varies, stress makes it worse. No other focal neurologic deficits, associated symptoms, inciting events or modifiable factors.  Medications tried: Topiramate, advil, citalopram, on Nortriptyline  Reviewed notes, labs and imaging from outside physicians, which showed:  TSH normal  Reviewed primary care notes.  This is a patient with 6 weeks of persistent left-sided headache worse on lying down at night, wakes with headaches persistent daily, over-the-counter medications such as NSAIDs not working.  No dizziness, no blurred vision, sometimes she has nausea, not aggravated by any foods, stress makes it worse, husband reports she is also snoring, left frontal headache daily.  Last year she was having a lot of pressure headaches.  No weakness no numbness.  Sleeps 8-9 hours.  No head injuries, no inciting events.  Headaches.    Observations/Objective:  Generalized: Well developed, in no acute distress  Mentation: Alert oriented to time, place, history taking. Follows all commands speech and language fluent   Assessment and Plan:  50 y.o. year old female  has a past medical history of BPPV (benign paroxysmal positional vertigo), Breast cyst, Chest pain, Fibrocystic breast, History of chicken pox, Infertility, female, Melanoma ([place]), Migraine, Ovarian cyst, Palpitations,  Premature ovarian failure, and Yeast infection. here with    ICD-10-CM   1. Migraine without aura and without status migrainosus, not intractable  G43.009   2. Gastroesophageal reflux disease, unspecified whether esophagitis present  K21.9   3. Scalp tenderness  R51.9    Madeline Walker is doing very well after resuming nortriptyline 10mg  at bedtime. Headaches have resolved. She rarely uses rizatriptan. We will continue nortriptyline 10mg  at bedtime and rizatriptan as needed. Refills sent for 1 year. She will continue to follow up with PCP regarding acid reflux. I do not feel it is likely related to nortriptyline dose as she was doing well for > 6 months on medication and no changes in symptoms during time she stopped nortriptyline. She reports that head tenderness is resolving. She will continue to monitor and follow up if concerns continue. She will follow up with me in 1 year, sooner if needed. She verbalizes understanding and agreement with this plan.   No orders of the defined types were placed in this encounter.   Meds ordered this encounter  Medications  . nortriptyline (PAMELOR) 10 MG capsule    Sig: Take 1 capsule (10 mg total) by mouth at bedtime.    Dispense:  90 capsule    Refill:  3    Order Specific Question:   Supervising Provider    Answer:   Melvenia Beam [id number]  . rizatriptan (MAXALT) 10 MG tablet    Sig: TAKE 1 TABLET BY MOUTH AS NEEDED FOR MIGRAINE. MAY REPEAT IN 2 HOURS IF NEEDED. MAX 2 TABLETS IN 24 HOURS.    Dispense:  10 tablet    Refill:  11    Pt needs an appt    Order Specific Question:   Supervising Provider    Answer:   Melvenia Beam [id number]     Follow Up Instructions:  I discussed the assessment and treatment plan with the patient. The patient was provided an opportunity to ask questions and all were answered. The patient agreed with the plan and demonstrated an understanding of the instructions.   The patient was advised to call back or seek an  in-person evaluation if the symptoms worsen or if the condition fails to improve as anticipated.  I provided 20 minutes of non-face-to-face time during this encounter. Patient is located at her place of residence during [place] visit. Provider is in the office.    Debbora Presto, NP   Made any corrections needed, and agree with history, physical, neuro exam,assessment and plan as stated.     Sarina Ill, MD [facility]

## 2019-07-28 DIAGNOSIS — E039 Hypothyroidism, unspecified: Secondary | ICD-10-CM | POA: Diagnosis not present

## 2019-07-28 DIAGNOSIS — L680 Hirsutism: Secondary | ICD-10-CM | POA: Diagnosis not present

## 2019-07-28 DIAGNOSIS — N951 Menopausal and female climacteric states: Secondary | ICD-10-CM | POA: Diagnosis not present

## 2019-07-28 DIAGNOSIS — K2100 Gastro-esophageal reflux disease with esophagitis, without bleeding: Secondary | ICD-10-CM | POA: Diagnosis not present

## 2019-09-02 DIAGNOSIS — E559 Vitamin D deficiency, unspecified: Secondary | ICD-10-CM | POA: Diagnosis not present

## 2019-09-02 DIAGNOSIS — E039 Hypothyroidism, unspecified: Secondary | ICD-10-CM | POA: Diagnosis not present

## 2019-09-02 DIAGNOSIS — N951 Menopausal and female climacteric states: Secondary | ICD-10-CM | POA: Diagnosis not present

## 2019-09-02 DIAGNOSIS — R5383 Other fatigue: Secondary | ICD-10-CM | POA: Diagnosis not present

## 2019-09-04 DIAGNOSIS — R1013 Epigastric pain: Secondary | ICD-10-CM | POA: Diagnosis not present

## 2019-09-04 DIAGNOSIS — K449 Diaphragmatic hernia without obstruction or gangrene: Secondary | ICD-10-CM | POA: Diagnosis not present

## 2019-09-04 DIAGNOSIS — K219 Gastro-esophageal reflux disease without esophagitis: Secondary | ICD-10-CM | POA: Diagnosis not present

## 2019-12-18 DIAGNOSIS — N952 Postmenopausal atrophic vaginitis: Secondary | ICD-10-CM | POA: Diagnosis not present

## 2019-12-18 DIAGNOSIS — N644 Mastodynia: Secondary | ICD-10-CM | POA: Diagnosis not present

## 2020-01-06 DIAGNOSIS — N644 Mastodynia: Secondary | ICD-10-CM | POA: Diagnosis not present

## 2020-01-14 DIAGNOSIS — N951 Menopausal and female climacteric states: Secondary | ICD-10-CM | POA: Diagnosis not present

## 2020-01-14 DIAGNOSIS — E039 Hypothyroidism, unspecified: Secondary | ICD-10-CM | POA: Diagnosis not present

## 2020-01-21 DIAGNOSIS — N644 Mastodynia: Secondary | ICD-10-CM | POA: Diagnosis not present

## 2020-01-21 DIAGNOSIS — G43909 Migraine, unspecified, not intractable, without status migrainosus: Secondary | ICD-10-CM | POA: Diagnosis not present

## 2020-01-21 DIAGNOSIS — R519 Headache, unspecified: Secondary | ICD-10-CM | POA: Diagnosis not present

## 2020-01-21 DIAGNOSIS — M9902 Segmental and somatic dysfunction of thoracic region: Secondary | ICD-10-CM | POA: Diagnosis not present

## 2020-01-28 DIAGNOSIS — H5213 Myopia, bilateral: Secondary | ICD-10-CM | POA: Diagnosis not present

## 2020-03-06 IMAGING — CT CT ABD-PELV W/ CM
2 of 5 series · 16 of 46 positions shown, 18 images · IV contrast (APPLIED)
Comparison: None.

CLINICAL DATA: Abdominal pain

EXAM:
CT ABDOMEN AND PELVIS WITH CONTRAST
TECHNIQUE: Multidetector CT imaging of the abdomen and pelvis was performed
using the standard protocol following bolus administration of
intravenous contrast.
CONTRAST:  100mL OMNIPAQUE IOHEXOL 300 MG/ML  SOLN

[Series 3: abdomen 5.0 · axial · 0.68mm/px · z∈[+885,+1245]mm · 13 of 84 slices shown, 15 images]
[im 6/84  soft-tissue]
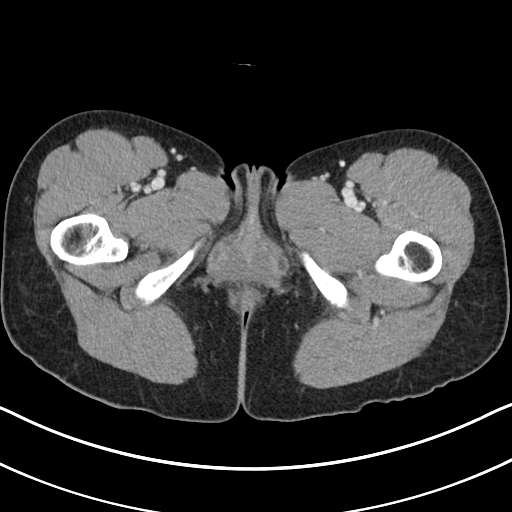
[im 6/84  bone]
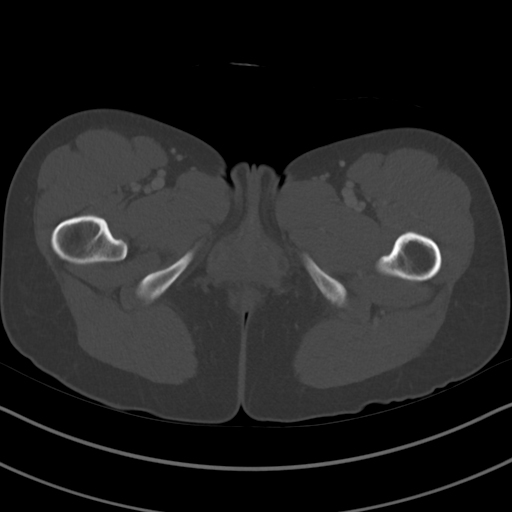
[im 11/84  soft-tissue]
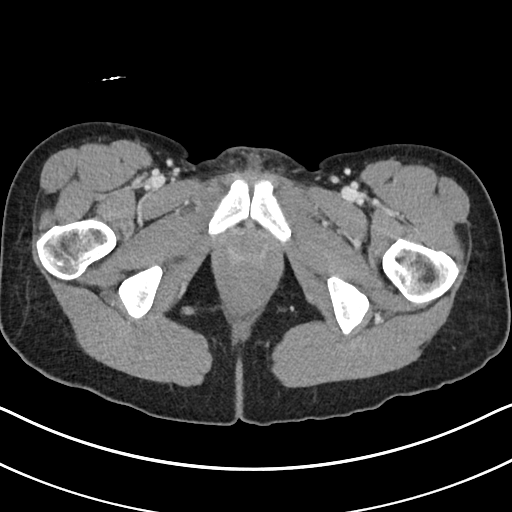
[im 16/84  soft-tissue]
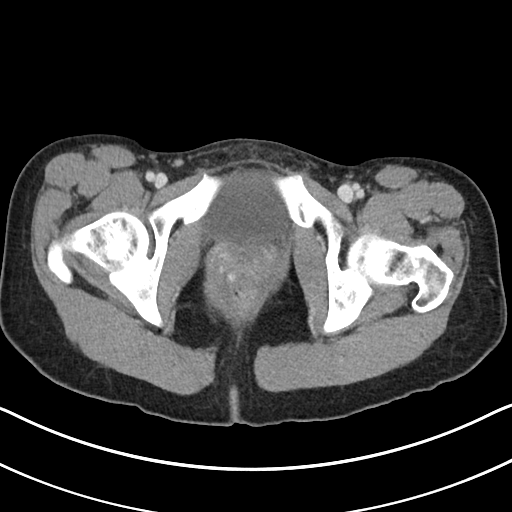
[im 26/84  soft-tissue]
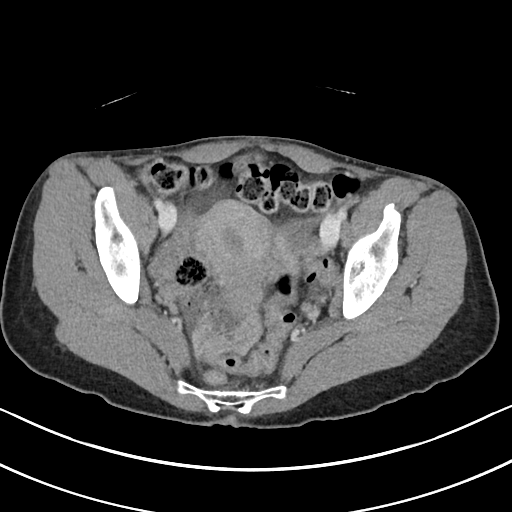
[im 32/84  soft-tissue]
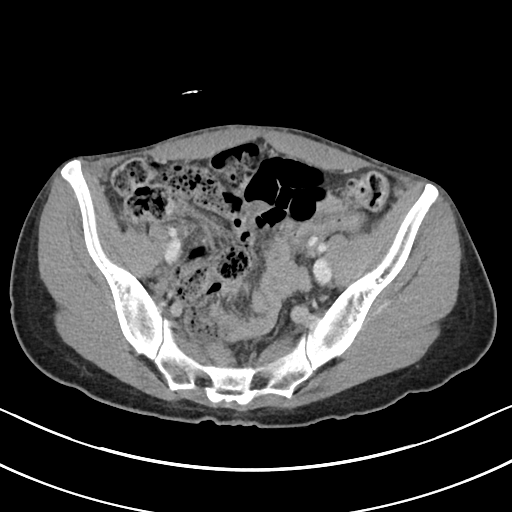
[im 37/84  soft-tissue]
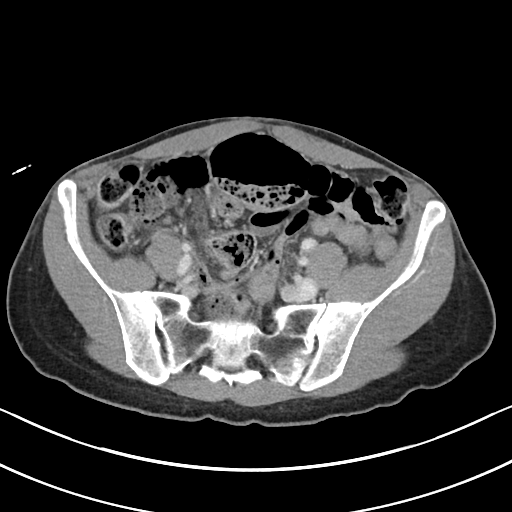
[im 42/84  soft-tissue]
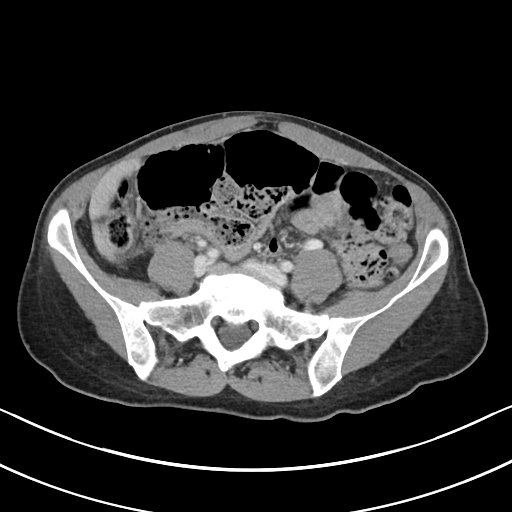
[im 47/84  soft-tissue]
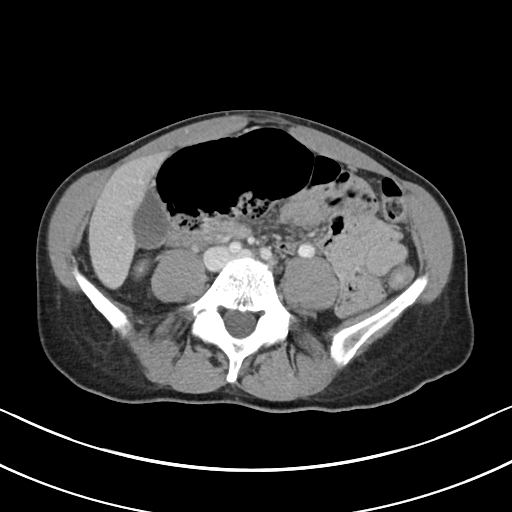
[im 52/84  soft-tissue]
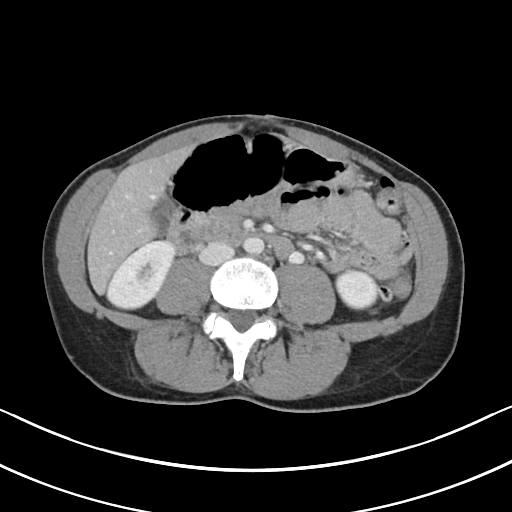
[im 52/84  bone]
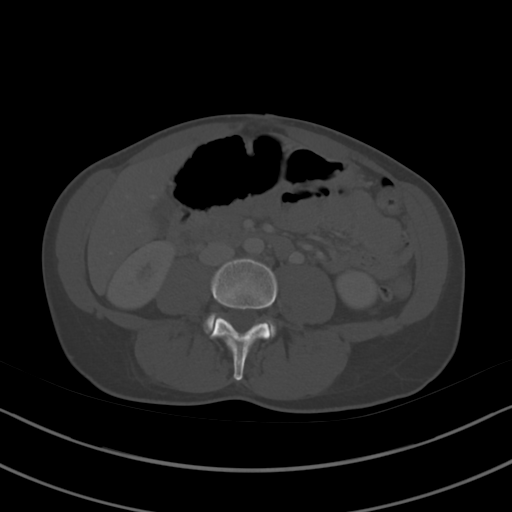
[im 58/84  soft-tissue]
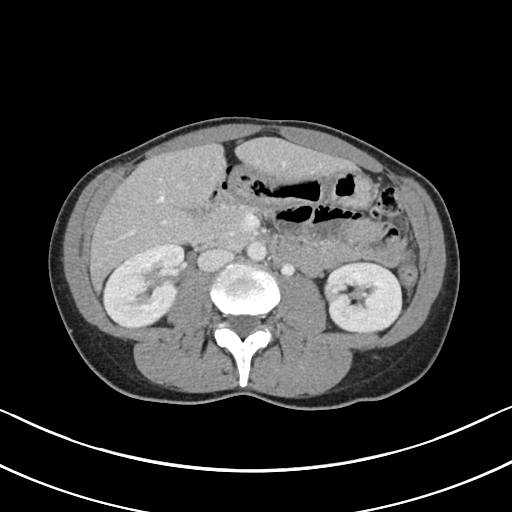
[im 68/84  soft-tissue]
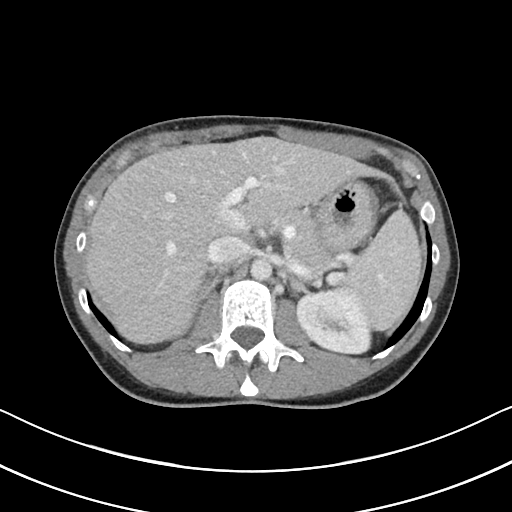
[im 73/84  soft-tissue]
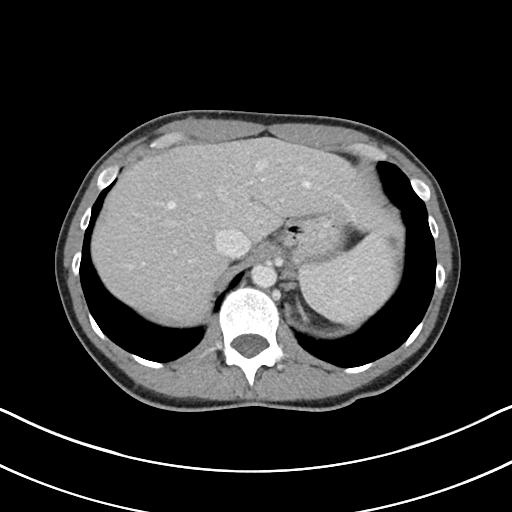
[im 78/84  soft-tissue]
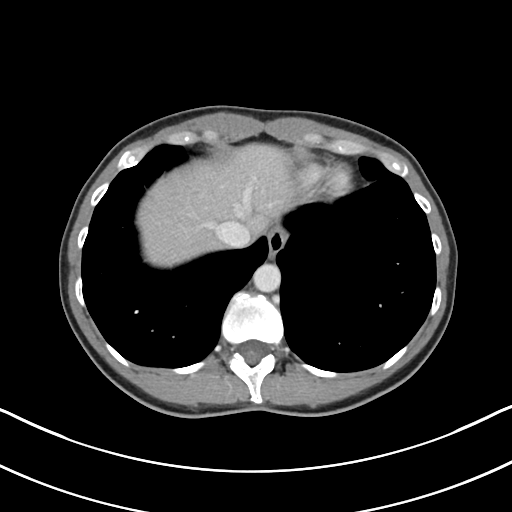

[Series 6: abdomen 3.0 mpr cor · coronal · 0.71mm/px · 3 of 65 slices shown]
[im 22/65  soft-tissue]
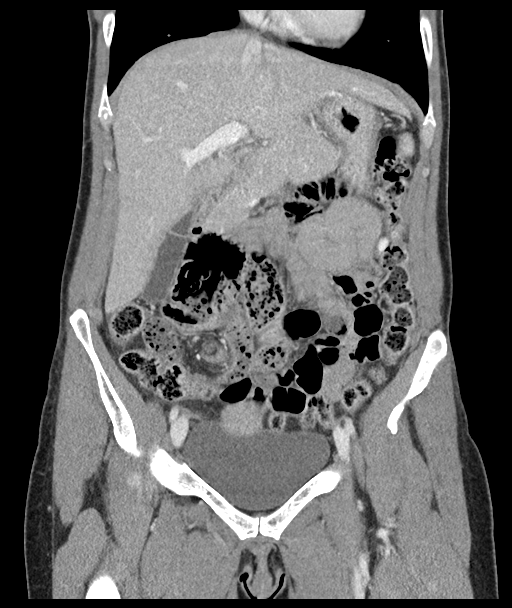
[im 29/65  soft-tissue]
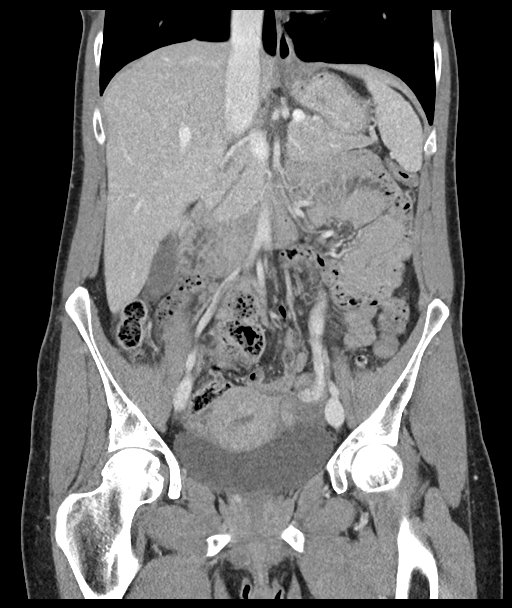
[im 36/65  soft-tissue]
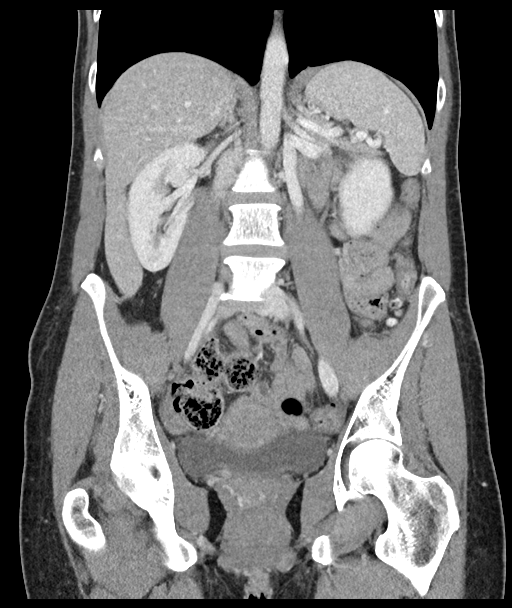

[16 of 46 positions shown; findings below may reference images not displayed]

FINDINGS: LOWER CHEST: No basilar pulmonary nodules or pleural effusion. No
apical pericardial effusion.

HEPATOBILIARY: Normal hepatic contours and density. No intra- or
extrahepatic biliary dilatation. Normal gallbladder.

PANCREAS: Normal parenchymal contours without ductal dilatation. No
peripancreatic fluid collection.

SPLEEN: Normal.

ADRENALS/URINARY TRACT:

--Adrenal glands: Normal.

--Right kidney/ureter: No hydronephrosis, nephroureterolithiasis,
perinephric stranding or solid renal mass.

--Left kidney/ureter: No hydronephrosis, nephroureterolithiasis,
perinephric stranding or solid renal mass.

--Urinary bladder: Normal for degree of distention

STOMACH/BOWEL:

--Stomach/Duodenum: No hiatal hernia or other gastric abnormality.
Normal duodenal course.

--Small bowel: No dilatation or inflammation.

--Colon: There is swirling mesenteric vasculature in the right lower
quadrant, best demonstrated on coronal images 20-29. The cecum is
twisted and dilated, pointing toward the left upper quadrant.

--Appendix: Not clearly visualized.

VASCULAR/LYMPHATIC: Normal course and caliber of the major abdominal
vessels. No abdominal or pelvic lymphadenopathy.

REPRODUCTIVE: No free fluid in the pelvis.

MUSCULOSKELETAL. No bony spinal canal stenosis or focal osseous
abnormality.

OTHER: None.
IMPRESSION: Twisted mesenteric vasculature in the right lower quadrant with
dilated, upturned cecum, consistent with cecal volvulus. These
results were called by telephone at the time of interpretation on
01/13/2018 at [DATE] to DAVIS JIM , who verbally acknowledged
these results.

## 2020-05-05 DIAGNOSIS — M9902 Segmental and somatic dysfunction of thoracic region: Secondary | ICD-10-CM | POA: Diagnosis not present

## 2020-05-06 DIAGNOSIS — L578 Other skin changes due to chronic exposure to nonionizing radiation: Secondary | ICD-10-CM | POA: Diagnosis not present

## 2020-05-06 DIAGNOSIS — D485 Neoplasm of uncertain behavior of skin: Secondary | ICD-10-CM | POA: Diagnosis not present

## 2020-05-06 DIAGNOSIS — D2261 Melanocytic nevi of right upper limb, including shoulder: Secondary | ICD-10-CM | POA: Diagnosis not present

## 2020-05-06 DIAGNOSIS — D225 Melanocytic nevi of trunk: Secondary | ICD-10-CM | POA: Diagnosis not present

## 2020-05-06 DIAGNOSIS — L700 Acne vulgaris: Secondary | ICD-10-CM | POA: Diagnosis not present

## 2020-05-11 DIAGNOSIS — M9902 Segmental and somatic dysfunction of thoracic region: Secondary | ICD-10-CM | POA: Diagnosis not present

## 2020-05-16 DIAGNOSIS — M9902 Segmental and somatic dysfunction of thoracic region: Secondary | ICD-10-CM | POA: Diagnosis not present

## 2020-05-23 DIAGNOSIS — M9901 Segmental and somatic dysfunction of cervical region: Secondary | ICD-10-CM | POA: Diagnosis not present

## 2020-05-23 DIAGNOSIS — M5386 Other specified dorsopathies, lumbar region: Secondary | ICD-10-CM | POA: Diagnosis not present

## 2020-05-23 DIAGNOSIS — M9903 Segmental and somatic dysfunction of lumbar region: Secondary | ICD-10-CM | POA: Diagnosis not present

## 2020-05-23 DIAGNOSIS — M9902 Segmental and somatic dysfunction of thoracic region: Secondary | ICD-10-CM | POA: Diagnosis not present

## 2020-06-07 DIAGNOSIS — M9903 Segmental and somatic dysfunction of lumbar region: Secondary | ICD-10-CM | POA: Diagnosis not present

## 2020-06-07 DIAGNOSIS — M9902 Segmental and somatic dysfunction of thoracic region: Secondary | ICD-10-CM | POA: Diagnosis not present

## 2020-06-07 DIAGNOSIS — M9901 Segmental and somatic dysfunction of cervical region: Secondary | ICD-10-CM | POA: Diagnosis not present

## 2020-06-07 DIAGNOSIS — M5386 Other specified dorsopathies, lumbar region: Secondary | ICD-10-CM | POA: Diagnosis not present

## 2020-06-14 DIAGNOSIS — M9901 Segmental and somatic dysfunction of cervical region: Secondary | ICD-10-CM | POA: Diagnosis not present

## 2020-06-14 DIAGNOSIS — M5386 Other specified dorsopathies, lumbar region: Secondary | ICD-10-CM | POA: Diagnosis not present

## 2020-06-14 DIAGNOSIS — M9903 Segmental and somatic dysfunction of lumbar region: Secondary | ICD-10-CM | POA: Diagnosis not present

## 2020-06-14 DIAGNOSIS — M9902 Segmental and somatic dysfunction of thoracic region: Secondary | ICD-10-CM | POA: Diagnosis not present

## 2020-06-16 ENCOUNTER — Ambulatory Visit: Payer: BC Managed Care – PPO | Admitting: Plastic Surgery

## 2020-06-16 ENCOUNTER — Encounter: Payer: Self-pay | Admitting: Plastic Surgery

## 2020-06-16 ENCOUNTER — Other Ambulatory Visit: Payer: Self-pay

## 2020-06-16 VITALS — BP 108/54 | HR 100 | Temp 98.2°F | Ht 69.0 in | Wt 135.6 lb

## 2020-06-16 DIAGNOSIS — L989 Disorder of the skin and subcutaneous tissue, unspecified: Secondary | ICD-10-CM

## 2020-06-16 NOTE — Progress Notes (Signed)
Referring Provider Leighton Ruff, MD [place],  [place] [address]   CC:  Chief Complaint  Patient presents with  . Advice Only      Madeline Walker is an 49 y.o. female.  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 This was followed by her dermatologist and done with a shave biopsy. She is here to discuss excision. She has had a lot of dysplastic nevi excised on her before including a melanoma in situ.  No Known Allergies  Outpatient Encounter Medications as of 06/16/2020  Medication Sig Note  . estradiol (VIVELLE-DOT) 0.05 MG/24HR patch Place 1 patch onto the skin every 3 (three) days. 06/13/2018: Took off 06/12/2018  . Estradiol 10 MCG TABS vaginal tablet Place 1 tablet vaginally 2 (two) times a week. For 5 weeks   . nortriptyline (PAMELOR) 10 MG capsule Take 1 capsule (10 mg total) by mouth at bedtime.   . progesterone (PROMETRIUM) 100 MG capsule Take 200 mg by mouth at bedtime.   . rizatriptan (MAXALT) 10 MG tablet TAKE 1 TABLET BY MOUTH AS NEEDED FOR MIGRAINE. MAY REPEAT IN 2 HOURS IF NEEDED. MAX 2 TABLETS IN 24 HOURS.   Marland Kitchen acetaminophen (TYLENOL) 325 MG tablet You can take 2 tablets every 4 hours as needed for pain.  This should be your first pain medicine.  If you still need more pain relief you can alternate with Ibuprofen and/or Oxycodone.    You can buy this drug at any drug store over the counter.  Do not take more than 4000 mg of Tylenol per day, it can harm your liver.   Marland Kitchen ibuprofen (ADVIL,MOTRIN) 200 MG tablet You can take 2-3 tablets every 6 hours as needed for pain.  You can alternate this with the plain tylenol, or the oxycodone. This should be your second choice for pain control.  You can buy this over the counter at any drug store.    Facility-Administered Encounter Medications as of 06/16/2020  Medication  . gadopentetate dimeglumine (MAGNEVIST) injection 13 mL     Past Medical History:   Diagnosis Date  . BPPV (benign paroxysmal positional vertigo)    [facility]  . Breast cyst   . Chest pain   . Fibrocystic breast   . History of chicken pox   . Infertility, female   . Melanoma ([place])    "right upper arm/shoulder"  . Migraine    "under control w/daily RX; still get one ~ q month" (01/13/2018)  . Ovarian cyst   . Palpitations   . Premature ovarian failure   . Yeast infection     Past Surgical History:  Procedure Laterality Date  . ABDOMINOPLASTY  2015  . CESAREAN SECTION  2002  . CESAREAN SECTION  10/31/2011   Procedure: CESAREAN SECTION;  Surgeon: Eldred Manges, MD;  Location: [facility];  Service: Gynecology;  Laterality: N/A;  . COLON SURGERY  01/13/2018   EXPLORATORY LAPAROTOMY AND EXTENDED RIGHT HEMICOLECTOMY  . DILATION AND CURETTAGE OF UTERUS  2010   SAB  . HERNIA REPAIR    . INCISIONAL HERNIA REPAIR N/A 06/13/2018   Procedure: VENTRAL INCISIONAL HERNIA REPAIR WITH  MESH ERAS PATHWAY;  Surgeon: Donnie Mesa, MD;  Location: [facility];  Service: General;  Laterality: N/A;  . LAPAROTOMY N/A 01/13/2018   Procedure: EXPLORATORY LAPAROTOMY AND EXTENDED RIGHT HEMICOLECTOMY;  Surgeon: Donnie Mesa, MD;  Location: [facility];  Service: General;  Laterality: N/A;  .  MELANOMA EXCISION Right    "arm; near shoulder"  . RHINOPLASTY  1994  . UMBILICAL HERNIA REPAIR      Family History  Problem Relation Age of Onset  . COPD Mother   . Mental illness Mother        DEPRESSION AND SUICIDE  . Migraines Mother   . Hypertension Father   . Cancer Father        KIDNEY AND PROSTATE  . Anemia Sister   . Autism Son   . Cancer Maternal Uncle        LUNG  . Rheum arthritis Maternal Grandmother   . Diabetes Maternal Grandfather        TYPE I  . Cancer Paternal Grandfather        colon    Social History   Social History Narrative   Lives at home with husband and children   Right handed   1/2 cup coffee in the morning     Review of  Systems General: Denies fevers, chills, weight loss CV: Denies chest pain, shortness of breath, palpitations  Physical Exam Vitals with BMI 06/16/2020 06/13/2018 06/13/2018  Height 5\' 9"  - -  Weight 135 lbs 10 oz - -  BMI 22.02 - -  Systolic 542 706 237  Diastolic 54 45 65  Pulse 628 101 106    General:  No acute distress,  Alert and oriented, Non-Toxic, Normal speech and affect Examination right shoulder shows a 6 to 7 mm diameter shave biopsy site. There is no obvious surrounding concerning lesions or scars.  Assessment/Plan Patient presents with a dysplastic nevus with severe atypia on the right posterior shoulder. We discussed excision with a margin. We discussed the risks include bleeding, infection, demonstrating structures and need for additional procedures. We discussed the location and the orientation of the scar and the fact that the shoulder scars tend to widen with time. All of her questions were answered and we will plan to get this scheduled for excision under local in the office.  Cindra Presume 06/16/2020, 1:11 PM

## 2020-06-17 ENCOUNTER — Other Ambulatory Visit: Payer: Self-pay | Admitting: Family Medicine

## 2020-06-17 DIAGNOSIS — R221 Localized swelling, mass and lump, neck: Secondary | ICD-10-CM

## 2020-06-17 DIAGNOSIS — K219 Gastro-esophageal reflux disease without esophagitis: Secondary | ICD-10-CM | POA: Diagnosis not present

## 2020-06-17 DIAGNOSIS — K449 Diaphragmatic hernia without obstruction or gangrene: Secondary | ICD-10-CM | POA: Diagnosis not present

## 2020-06-20 DIAGNOSIS — M9901 Segmental and somatic dysfunction of cervical region: Secondary | ICD-10-CM | POA: Diagnosis not present

## 2020-06-20 DIAGNOSIS — M5386 Other specified dorsopathies, lumbar region: Secondary | ICD-10-CM | POA: Diagnosis not present

## 2020-06-20 DIAGNOSIS — M9903 Segmental and somatic dysfunction of lumbar region: Secondary | ICD-10-CM | POA: Diagnosis not present

## 2020-06-20 DIAGNOSIS — M9902 Segmental and somatic dysfunction of thoracic region: Secondary | ICD-10-CM | POA: Diagnosis not present

## 2020-06-21 ENCOUNTER — Ambulatory Visit
Admission: RE | Admit: 2020-06-21 | Discharge: 2020-06-21 | Disposition: A | Discharge status: Home / Self Care | Payer: BC Managed Care – PPO | Source: Ambulatory Visit | Attending: Family Medicine | Admitting: Family Medicine

## 2020-06-21 DIAGNOSIS — E042 Nontoxic multinodular goiter: Secondary | ICD-10-CM | POA: Diagnosis not present

## 2020-06-21 DIAGNOSIS — R221 Localized swelling, mass and lump, neck: Secondary | ICD-10-CM

## 2020-06-22 ENCOUNTER — Encounter: Payer: Self-pay | Admitting: Plastic Surgery

## 2020-06-22 ENCOUNTER — Ambulatory Visit (INDEPENDENT_AMBULATORY_CARE_PROVIDER_SITE_OTHER): Payer: BC Managed Care – PPO | Admitting: Plastic Surgery

## 2020-06-22 ENCOUNTER — Other Ambulatory Visit: Payer: Self-pay

## 2020-06-22 ENCOUNTER — Other Ambulatory Visit (HOSPITAL_COMMUNITY)
Admission: RE | Admit: 2020-06-22 | Discharge: 2020-06-22 | Disposition: A | Discharge status: Home / Self Care | Payer: BC Managed Care – PPO | Source: Ambulatory Visit | Attending: Plastic Surgery | Admitting: Plastic Surgery

## 2020-06-22 VITALS — BP 138/54 | HR 92 | Temp 97.9°F

## 2020-06-22 DIAGNOSIS — L989 Disorder of the skin and subcutaneous tissue, unspecified: Secondary | ICD-10-CM

## 2020-06-22 NOTE — Progress Notes (Signed)
Operative Note   DATE OF OPERATION: 06/22/2020  LOCATION:    SURGICAL DEPARTMENT: Plastic Surgery  PREOPERATIVE DIAGNOSES:  Right shoulder atypical nevus  POSTOPERATIVE DIAGNOSES:  same  PROCEDURE:  1. Excision of right shoulder lesion measuring 3 cm 2. Complex closure measuring 3 cm  SURGEON: Talmadge Coventry, MD  ANESTHESIA:  Local  COMPLICATIONS: None.   INDICATIONS FOR PROCEDURE:  The patient, Madeline Walker is a 49 y.o. female born on October 29, 1970, is here for treatment of right shoulder nevus w atypia. MRN: [id number]  CONSENT:  Informed consent was obtained directly from the patient. Risks, benefits and alternatives were fully discussed. Specific risks including but not limited to bleeding, infection, hematoma, seroma, scarring, pain, infection, wound healing problems, and need for further surgery were all discussed. The patient did have an ample opportunity to have questions answered to satisfaction.   DESCRIPTION OF PROCEDURE:  Local anesthesia was administered. The patient's operative site was prepped and draped in a sterile fashion. A time out was performed and all information was confirmed to be correct.  The lesion was excised with a 15 blade.  Hemostasis was obtained.  Circumferential undermining was performed and the skin was advanced and closed in layers with interrupted buried Monocryl sutures and 3-0 monocryl for the skin.  The lesion excised measured 3 cm, and the total length of closure measured 3 cm.    The patient tolerated the procedure well.  There were no complications.

## 2020-06-27 LAB — SURGICAL PATHOLOGY

## 2020-07-05 ENCOUNTER — Ambulatory Visit: Payer: BC Managed Care – PPO | Admitting: Internal Medicine

## 2020-07-05 ENCOUNTER — Encounter: Payer: Self-pay | Admitting: Internal Medicine

## 2020-07-05 ENCOUNTER — Other Ambulatory Visit: Payer: Self-pay

## 2020-07-05 VITALS — BP 122/82 | HR 75 | Ht 69.0 in | Wt 132.2 lb

## 2020-07-05 DIAGNOSIS — E042 Nontoxic multinodular goiter: Secondary | ICD-10-CM | POA: Insufficient documentation

## 2020-07-05 LAB — T4, FREE: Free T4: 1.03 ng/dL (ref 0.60–1.60)

## 2020-07-05 LAB — TSH: TSH: 0.69 u[IU]/mL (ref 0.35–4.50)

## 2020-07-05 LAB — T3, FREE: T3, Free: 3.1 pg/mL (ref 2.3–4.2)

## 2020-07-05 NOTE — Progress Notes (Signed)
Patient ID: Madeline Walker, female   DOB: 1971/03/10, 50 y.o.   MRN: [id number]   This visit occurred during the SARS-CoV-2 public health emergency.  Safety protocols were in place, including screening questions prior to the visit, additional usage of staff PPE, and extensive cleaning of exam room while observing appropriate contact time as indicated for disinfecting solutions.   HPI  Madeline Walker is a 50 y.o.-year-old female, referred by Kristen Loader, FNP, for evaluation for and management of thyroid nodules.  Patient describes that she noticed swelling in her L neck radiating to the ear last month and presented to see her PCP.  At that time, she was found to have a thyroid ultrasound.  This showed a slightly enlarged right thyroid lobe and 2 thyroid nodules. On the ultrasound, it appeared that the area of concern was a normal-appearing lymph node.  She started: Turmeric, Boswellia, Ibuprofen, Ashwagandha, Frankinsense and Lemongrass essential oils, black seed oil.  The inflammation on the left side of her neck decreased in size, but she is still feeling occasional pain in her ear.  She was referred to endocrinology for further evaluation of her thyroid nodules.  Thyroid U/S (06/22/2019): Right thyroid lobe larger than the left, 2 thyroid nodules in the right lobe:  Parenchymal Echotexture: Mildly heterogenous Isthmus: 0.2 cm thickness Right lobe: 6.5 x 1.5 x 1.8 cm Left lobe: 4.4 x 1.2 x 1.4 cm _________________________________________________________  Nodule # 1: Location: Right; Superior Maximum size: 0.9 cm; Other 2 dimensions: 0.7 x 0.6 cm Composition: cannot determine (2) Echogenicity: hypoechoic (2) Shape: not taller-than-wide (0) Margins: ill-defined (0) Echogenic foci: punctate echogenic foci (3) ACR TI-RADS total points: 7. *Given size (>/= 0.5 - 0.9 cm) and appearance, a follow-up ultrasound in 1 year should be considered based on TI-RADS  criteria. _________________________________________________________  Nodule # 2: Location: Right; Mid Maximum size: 1.5 cm; Other 2 dimensions:   1.2 x 0.9 cm Composition: mixed cystic and solid (1) Echogenicity: hypoechoic (2) Shape: not taller-than-wide (0) Margins: ill-defined (0) Echogenic foci: none (0) ACR TI-RADS total points: 3. *Given size (>/= 1.5 - 2.4 cm) and appearance, a follow-up ultrasound in 1 year should be considered based on TI-RADS criteria. _________________________________________________________  Morphologically unremarkable cervical lymph nodes bilaterally, all less than 0.5 mm are noted. There is a 0.4 cm node superior to the thyroid corresponding to palpable area of concern.  IMPRESSION: 1. Mild thyromegaly with  nodules.  None meets criteria for biopsy. 2. Recommend annual/biennial ultrasound follow-up of right nodules as above, until stability x5 years confirmed. 3. Morphologically unremarkable 0.4 cm lymph node in palpable region of concern.        Pt denies: - feeling nodules in neck - hoarseness - dysphagia - choking - SOB with lying down  Latest TSH was normal: 06/17/2020: TSH 0.67 (0.34-4.5) No results found for: TSH, FREET4   Pt denies: - fatigue - heat intolerance/cold intolerance - tremors - palpitations - hyperdefecation/constipation - weight loss/weight gain - dry skin - hair loss  No FH of thyroid ds. No FH of thyroid cancer. No h/o radiation tx to head or neck.  No seaweed or kelp. No recent contrast studies. No steroid use. No herbal supplements. No Biotin supplements or Hair, Skin and Nails vitamins.  Of note, she went through menopause early, at 50 y/o (POF), and she was started on estradiol transdermal patch, bioidentical progesterone and testosterone compounded cream - she gets these from [employer].  She is also on a supplement  to control estrogen, which contains 600 mcg iodine  (400% of the rec'd TDD)  She has a history of a miscarriage in 2010, anxiety, BPPV, h/o volvulus sx 2019, followed by abdominal hernia resection with mesh placement, with postsx hiatal hernia and GERD.  ROS: Constitutional: + See HPI  Eyes: no blurry vision, no xerophthalmia ENT: no sore throat,  + see HPI, + ear ache Cardiovascular: no CP/SOB/palpitations/leg swelling Respiratory: no cough/SOB Gastrointestinal: no N/V/D/C/+ heartburn Musculoskeletal: no muscle/joint aches Skin: no rashes Neurological: no tremors/numbness/tingling/dizziness Psychiatric: + depression/+ anxiety  Past Medical History:  Diagnosis Date  . BPPV (benign paroxysmal positional vertigo)    [facility]  . Breast cyst   . Chest pain   . Fibrocystic breast   . History of chicken pox   . Infertility, female   . Melanoma ([place])    "right upper arm/shoulder"  . Migraine    "under control w/daily RX; still get one ~ q month" (01/13/2018)  . Ovarian cyst   . Palpitations   . Premature ovarian failure   . Yeast infection    Past Surgical History:  Procedure Laterality Date  . ABDOMINOPLASTY  2015  . CESAREAN SECTION  2002  . CESAREAN SECTION  10/31/2011   Procedure: CESAREAN SECTION;  Surgeon: Eldred Manges, MD;  Location: [facility];  Service: Gynecology;  Laterality: N/A;  . COLON SURGERY  01/13/2018   EXPLORATORY LAPAROTOMY AND EXTENDED RIGHT HEMICOLECTOMY  . DILATION AND CURETTAGE OF UTERUS  2010   SAB  . HERNIA REPAIR    . INCISIONAL HERNIA REPAIR N/A 06/13/2018   Procedure: VENTRAL INCISIONAL HERNIA REPAIR WITH  MESH ERAS PATHWAY;  Surgeon: Donnie Mesa, MD;  Location: [place];  Service: General;  Laterality: N/A;  . LAPAROTOMY N/A 01/13/2018   Procedure: EXPLORATORY LAPAROTOMY AND EXTENDED RIGHT HEMICOLECTOMY;  Surgeon: Donnie Mesa, MD;  Location: [facility];  Service: General;  Laterality: N/A;  . MELANOMA EXCISION Right    "arm; near shoulder"  . RHINOPLASTY  1994  .  UMBILICAL HERNIA REPAIR     Social History   Socioeconomic History  . Marital status: Married    Spouse name: Not on file  . Number of children: 2  . Years of education: 14  . Highest education level: Master's degree (e.g., MA, MS, MEng, MEd, MSW, MBA)  Occupational History  . Not on file  Tobacco Use  . Smoking status: Never Smoker  . Smokeless tobacco: Never Used  Vaping Use  . Vaping Use: Never used  Substance and Sexual Activity  . Alcohol use: Yes    Alcohol/week: 1.0 standard drink    Types: 1 Standard drinks or equivalent per week  . Drug use: Not Currently  . Sexual activity: Yes    Comment: menopause  Other Topics Concern  . Not on file  Social History Narrative   Lives at home with husband and children   Right handed   1/2 cup coffee in the morning   Social Determinants of Health   Financial Resource Strain: Not on file  Food Insecurity: Not on file  Transportation Needs: Not on file  Physical Activity: Not on file  Stress: Not on file  Social Connections: Not on file  Intimate Partner Violence: Not on file   Current Outpatient Medications on File Prior to Visit  Medication Sig Dispense Refill  . estradiol (VIVELLE-DOT) 0.05 MG/24HR patch Place 1 patch onto the skin every 3 (three) days.  1  . Estradiol 10 MCG  TABS vaginal tablet Place 1 tablet vaginally 2 (two) times a week. For 5 weeks    . nortriptyline (PAMELOR) 10 MG capsule Take 1 capsule (10 mg total) by mouth at bedtime. 90 capsule 3  . progesterone (PROMETRIUM) 100 MG capsule Take 200 mg by mouth at bedtime.  2  . rizatriptan (MAXALT) 10 MG tablet TAKE 1 TABLET BY MOUTH AS NEEDED FOR MIGRAINE. MAY REPEAT IN 2 HOURS IF NEEDED. MAX 2 TABLETS IN 24 HOURS. 10 tablet 11   Current Facility-Administered Medications on File Prior to Visit  Medication Dose Route Frequency Provider Last Rate Last Admin  . gadopentetate dimeglumine (MAGNEVIST) injection 13 mL  13 mL Intravenous Once PRN Melvenia Beam, MD        No Known Allergies Family History  Problem Relation Age of Onset  . COPD Mother   . Mental illness Mother        DEPRESSION AND SUICIDE  . Migraines Mother   . Hypertension Father   . Cancer Father        KIDNEY AND PROSTATE  . Anemia Sister   . Autism Son   . Cancer Maternal Uncle        LUNG  . Rheum arthritis Maternal Grandmother   . Diabetes Maternal Grandfather        TYPE I  . Cancer Paternal Grandfather        colon   PE: BP 122/82   Pulse 75   Ht 5\' 9"  (1.753 m)   Wt 132 lb 3.2 oz (60 kg)   LMP 01/29/2011   SpO2 98%   BMI 19.52 kg/m  Wt Readings from Last 3 Encounters:  07/05/20 132 lb 3.2 oz (60 kg)  06/16/20 135 lb 9.6 oz (61.5 kg)  06/13/18 136 lb 3.9 oz (61.8 kg)   Constitutional: normal weight, in NAD Eyes: PERRLA, EOMI, no exophthalmos ENT: moist mucous membranes, no thyromegaly, but thyroid visible with deglutition, no thyroid nodules palpated, no cervical lymphadenopathy but fullness palpated on the right side of the neck Cardiovascular: RRR, No MRG Respiratory: CTA B Gastrointestinal: abdomen soft, NT, ND, BS+ Musculoskeletal: no deformities, strength intact in all 4;  Skin: moist, warm, no rashes Neurological: no tremor with outstretched hands, DTR normal in all 4  ASSESSMENT: 1.  Multiple thyroid nodules  2.  Enlarged lymph node  PLAN: 1.  Multiple thyroid nodules - Patient with history of anterior neck swelling, was found to be a normal-appearing lymph nodes upon my recent thyroid ultrasound.  However, during the investigation for this, patient was found to have an enlarged right thyroid lobe and to the right thyroid nodules. - I reviewed the images of her thyroid ultrasound along with the patient. I pointed out that the dominant nodules are not large.  They are mostly hypoechoic, without internal blood flow, more wide than tall and well elevated from the surrounding medications.  However, they do contain hyperechoic foci, difficult to  determine whether these are microcalcifications or colloid crystals, however, some of the particles do have a slight comet tail, pointing towards benignity.  The largest nodule contains a significant amount of fluid. - Pt does not have a thyroid cancer family history or a personal history of RxTx to head/neck. All these would favor benignity.  - We discussed about about the fact that all of the above are just predictive factors, but the biopsy would be indicated to clarify whether her nodules are cancerous or not.  However, at this point, the nodules  do not meet criteria for biopsy.  The smallest nodule appears to be slightly more worrisome, but this nodule is subcentimeter and very close to the carotid, so for now, I recommend biopsy.  We discussed that even if we do this later, when and if the nodule enlarges, and it turns to be cancerous, this would not change her life expectancy or her quality of life -For now, I suggested to check a thyroid ultrasound in a year and biopsied the nodules only if they increase in size or change ultrasound characteristics.  She agrees with this plan. -At today's visit, see she tells me that she still feels fullness on the left side of her neck, will check antithyroid antibodies to screen for Hashimoto's thyroiditis.  We discussed the treatment for this condition is with thyroid TFTs are normal - I reviewed her most recent TSH and this was normal.  We will repeat a TSH, free T4, free T3 today. -We did discuss about ashwagandha and possible consequences on thyroid function.  I advised her to stop it.  However, since the TSH was recently normal and she has been taking her supplement with iodine for several years, we can continue this - I will see her back in 1 year  2.  Enlarged lymph node -Discussed about possible causes for enlarged lymph nodes including inflammation and infection -Previously enlarged lymph nodes appears to have decreased in size either spontaneously or  after she started to take anti-inflammatory supplements and use topical essential oils. -On the ultrasound, none of the lymph nodes had worrisome appearance or were enlarged -For now, we can continue to follow this clinically  Component     Latest Ref Rng & Units 07/05/2020  TSH     0.35 - 4.50 uIU/mL 0.69  T4,Free(Direct)     0.60 - 1.60 ng/dL 4.82  Triiodothyronine,Free,Serum     2.3 - 4.2 pg/mL 3.1  Thyroglobulin Ab     < or = 1 IU/mL <1  Thyroperoxidase Ab SerPl-aCnc     <9 IU/mL 2   No signs of Hashimoto's thyroiditis and TFTs are normal.  Carlus Pavlov, MD PhD [facility]

## 2020-07-05 NOTE — Patient Instructions (Addendum)
Please stop at the lab.  Please come back for a follow-up appointment in 1 year, but send me a message 3-4 weeks before to order a new Thyroid U/S.   Thyroid Nodule  A thyroid nodule is an isolated growth of thyroid cells that forms a lump in your thyroid gland. The thyroid gland is a butterfly-shaped gland. It is found in the lower front of your neck. This gland sends chemical messengers (hormones) through your blood to all parts of your body. These hormones are important in regulating your body temperature and helping your body to use energy. Thyroid nodules are common. Most are not cancerous (benign). You may have one nodule or several nodules. Different types of thyroid nodules include nodules that:  Grow and fill with fluid (thyroid cysts).  Produce too much thyroid hormone (hot nodules or hyperthyroid).  Produce no thyroid hormone (cold nodules or hypothyroid).  Form from cancer cells (thyroid cancers). What are the causes? In most cases, the cause of this condition is not known. What increases the risk? The following factors may make you more likely to develop this condition.  Age. Thyroid nodules become more common in people who are older than 50 years of age.  Gender. ? Benign thyroid nodules are more common in women. ? Cancerous (malignant) thyroid nodules are more common in men.  A family history that includes: ? Thyroid nodules. ? Pheochromocytoma. ? Thyroid carcinoma. ? Hyperparathyroidism.  Certain kinds of thyroid diseases, such as Hashimoto's thyroiditis.  Lack of iodine in your diet.  A history of head and neck radiation, such as from previous cancer treatment. What are the signs or symptoms? In many cases, there are no symptoms. If you have symptoms, they may include:  A lump in your lower neck.  Feeling a lump or tickle in your throat.  Pain in your neck, jaw, or ear.  Having trouble swallowing. Hot nodules may cause symptoms that  include:  Weight loss.  Warm, flushed skin.  Feeling hot.  Feeling nervous.  A racing heartbeat. Cold nodules may cause symptoms that include:  Weight gain.  Dry skin.  Brittle hair. This may also occur with hair loss.  Feeling cold.  Fatigue. Thyroid cancer nodules may cause symptoms that include:  Hard nodules that feel stuck to the thyroid gland.  Hoarseness.  Lumps in the glands near your thyroid (lymph nodes). How is this diagnosed? A thyroid nodule may be felt by your health care provider during a physical exam. This condition may also be diagnosed based on your symptoms. You may also have tests, including:  An ultrasound. This may be done to confirm the diagnosis.  A biopsy. This involves taking a sample from the nodule and looking at it under a microscope.  Blood tests to make sure that your thyroid is working properly.  A thyroid scan. This test uses a radioactive tracer injected into a vein to create an image of the thyroid gland on a computer screen.  Imaging tests such as MRI or CT scan. These may be done if: ? Your nodule is large. ? Your nodule is blocking your airway. ? Cancer is suspected. How is this treated? Treatment depends on the cause and size of your nodule or nodules. If the nodule is benign, treatment may not be necessary. Your health care provider may monitor the nodule to see if it goes away without treatment. If the nodule continues to grow, is cancerous, or does not go away, treatment may be needed. Treatment may  include:  Having a cystic nodule drained with a needle.  Ablation therapy. In this treatment, alcohol is injected into the area of the nodule to destroy the cells. Ablation with heat (thermal ablation) may also be used.  Radioactive iodine. In this treatment, radioactive iodine is given as a pill or liquid that you drink. This substance causes the thyroid nodule to shrink.  Surgery to remove the nodule. Part or all of your  thyroid gland may need to be removed as well.  Medicines. Follow these instructions at home:  Pay attention to any changes in your nodule.  Take over-the-counter and prescription medicines only as told by your health care provider.  Keep all follow-up visits as told by your health care provider. This is important. Contact a health care provider if:  Your voice changes.  You have trouble swallowing.  You have pain in your neck, ear, or jaw that is getting worse.  Your nodule gets bigger.  Your nodule starts to make it harder for you to breathe.  Your muscles look like they are shrinking (muscle wasting). Get help right away if:  You have chest pain.  There is a loss of consciousness.  You have a sudden fever.  You feel confused.  You are seeing or hearing things that other people do not see or hear (having hallucinations).  You feel very weak.  You have mood swings.  You feel very restless.  You feel suddenly nauseous or throw up.  You suddenly have diarrhea. Summary  A thyroid nodule is an isolated growth of thyroid cells that forms a lump in your thyroid gland.  Thyroid nodules are common. Most are not cancerous (benign). You may have one nodule or several nodules.  Treatment depends on the cause and size of your nodule or nodules. If the nodule is benign, treatment may not be necessary.  Your health care provider may monitor the nodule to see if it goes away without treatment. If the nodule continues to grow, is cancerous, or does not go away, treatment may be needed. This information is not intended to replace advice given to you by your health care provider. Make sure you discuss any questions you have with your health care provider. Document Revised: 01/31/2018 Document Reviewed: 02/03/2018 Elsevier Patient Education  2020 [employer].

## 2020-07-06 LAB — THYROID PEROXIDASE ANTIBODY: Thyroperoxidase Ab SerPl-aCnc: 2 IU/mL (ref <9)

## 2020-07-06 LAB — THYROGLOBULIN ANTIBODY: Thyroglobulin Ab: <1 IU/mL (ref <=1)

## 2020-08-02 ENCOUNTER — Telehealth: Payer: Self-pay | Admitting: Neurology

## 2020-08-02 NOTE — Telephone Encounter (Signed)
..   Pt understands that although there may be some limitations with this type of visit, we will take all precautions to reduce any security or privacy concerns.  Pt understands that this will be treated like an in office visit and we will file with pt's insurance, and there may be a patient responsible charge related to this service. ? ?

## 2020-08-21 ENCOUNTER — Other Ambulatory Visit: Payer: Self-pay | Admitting: Neurology

## 2020-08-21 MED ORDER — NORTRIPTYLINE HCL 10 MG PO CAPS: 10.0000 mg | ORAL_CAPSULE | Freq: Every day | ORAL | 0 refills | Status: DC

## 2020-08-29 ENCOUNTER — Telehealth (INDEPENDENT_AMBULATORY_CARE_PROVIDER_SITE_OTHER): Payer: BC Managed Care – PPO | Admitting: Neurology

## 2020-08-29 DIAGNOSIS — G43009 Migraine without aura, not intractable, without status migrainosus: Secondary | ICD-10-CM

## 2020-08-29 MED ORDER — RIZATRIPTAN BENZOATE 10 MG PO TABS: ORAL_TABLET | ORAL | 11 refills | Status: DC

## 2020-08-29 MED ORDER — NORTRIPTYLINE HCL 10 MG PO CAPS: 20.0000 mg | ORAL_CAPSULE | Freq: Every day | ORAL | 4 refills | Status: DC

## 2020-08-29 NOTE — Progress Notes (Addendum)
[facility]    Provider:  Dr Jaynee Eagles Referring Provider: Leighton Ruff, MD Primary Care Physician:  Leighton Ruff, MD  CC:  Migraines  Virtual Visit via Video Note  I connected with Madeline Walker on 08/29/20 at  2:00 PM EST by a video enabled telemedicine application and verified that I am speaking with the correct person using two identifiers.  Location: Patient: home Provider: office   I discussed the limitations of evaluation and management by telemedicine and the availability of in person appointments. The patient expressed understanding and agreed to proceed.   Follow Up Instructions:    I discussed the assessment and treatment plan with the patient. The patient was provided an opportunity to ask questions and all were answered. The patient agreed with the plan and demonstrated an understanding of the instructions.   The patient was advised to call back or seek an in-person evaluation if the symptoms worsen or if the condition fails to improve as anticipated.  I provided 10 minutes of non-face-to-face time during this encounter.   Melvenia Beam, MD   Interval history 08/12/2017:  Patient here for follow up of migraines. Reviewed MRI brain images today with patient, was normal. No aura. She is doing exceptionally well, no headaches, doing great on Nortriptyline, Reviewed images in detail with patient of her brain, answered ann questions, follow up in one year or sooner if necessary. Email with any non-urgent issues.  ROS: no headaches, endorses stress otherwise negative  HPI:  Madeline Walker is a 50 y.o. female here as a referral from Dr. Drema Dallas for past medical history of benign positional vertigo, stress and anxiety, chest pain and palpitations, anxiety. She was started on Topiramate. New headache started 6 months ago. It is on the left side of her head. She also feels it in the back of her head. Usually over her left eye. Worse positionally  when bending over for example, feels like pressure like her head was going to "blow", bending over would start the headache and last the whole day. Sometimes it is dull, sometimes it throbs so much she feels it in her neck, she stopped taking OTC meds, advil did not help. She cannot tolerate Topiramate. She can have related nausea, sound sensitivity - sound can worsen the headache. Movement makes it worse. No vomiting. Quiet dark rooms will help. Mother with migraines. She has daily headaches. All are migrainous. Severity varies, stress makes it worse. No other focal neurologic deficits, associated symptoms, inciting events or modifiable factors.  Medications tried: Topiramate, advil, citalopram, on Nortriptyline  Reviewed notes, labs and imaging from outside physicians, which showed:  TSH normal  Reviewed primary care notes.  This is a patient with 6 weeks of persistent left-sided headache worse on lying down at night, wakes with headaches persistent daily, over-the-counter medications such as NSAIDs not working.  No dizziness, no blurred vision, sometimes she has nausea, not aggravated by any foods, stress makes it worse, husband reports she is also snoring, left frontal headache daily.  Last year she was having a lot of pressure headaches.  No weakness no numbness.  Sleeps 8-9 hours.  No head injuries, no inciting events.  Headaches.  Review of Systems: Patient complains of symptoms per HPI as well as the following symptoms: headache . Pertinent negatives and positives per HPI. All others negative    Social History   Socioeconomic History  . Marital status: Married    Spouse name: Not on file  . Number of children:  2  . Years of education: 69  . Highest education level: Master's degree (e.g., MA, MS, MEng, MEd, MSW, MBA)  Occupational History  . Not on file  Tobacco Use  . Smoking status: Never Smoker  . Smokeless tobacco: Never Used  Vaping Use  . Vaping Use: Never used  Substance  and Sexual Activity  . Alcohol use: Yes    Alcohol/week: 1.0 standard drink    Types: 1 Standard drinks or equivalent per week  . Drug use: Not Currently  . Sexual activity: Yes    Comment: menopause  Other Topics Concern  . Not on file  Social History Narrative   Lives at home with husband and children   Right handed   1/2 cup coffee in the morning   Social Determinants of Health   Financial Resource Strain: Not on file  Food Insecurity: Not on file  Transportation Needs: Not on file  Physical Activity: Not on file  Stress: Not on file  Social Connections: Not on file  Intimate Partner Violence: Not on file    Family History  Problem Relation Age of Onset  . COPD Mother   . Mental illness Mother        DEPRESSION AND SUICIDE  . Migraines Mother   . Hypertension Father   . Cancer Father        KIDNEY AND PROSTATE  . Anemia Sister   . Autism Son   . Cancer Maternal Uncle        LUNG  . Rheum arthritis Maternal Grandmother   . Diabetes Maternal Grandfather        TYPE I  . Cancer Paternal Grandfather        colon    Past Medical History:  Diagnosis Date  . BPPV (benign paroxysmal positional vertigo)    [facility]  . Breast cyst   . Chest pain   . Fibrocystic breast   . History of chicken pox   . Infertility, female   . Melanoma ([place])    "right upper arm/shoulder"  . Migraine    "under control w/daily RX; still get one ~ q month" (01/13/2018)  . Ovarian cyst   . Palpitations   . Premature ovarian failure   . Yeast infection     Past Surgical History:  Procedure Laterality Date  . ABDOMINOPLASTY  2015  . CESAREAN SECTION  2002  . CESAREAN SECTION  10/31/2011   Procedure: CESAREAN SECTION;  Surgeon: Eldred Manges, MD;  Location: [facility];  Service: Gynecology;  Laterality: N/A;  . COLON SURGERY  01/13/2018   EXPLORATORY LAPAROTOMY AND EXTENDED RIGHT HEMICOLECTOMY  . DILATION AND CURETTAGE OF UTERUS  2010   SAB  . HERNIA REPAIR    . INCISIONAL  HERNIA REPAIR N/A 06/13/2018   Procedure: VENTRAL INCISIONAL HERNIA REPAIR WITH  MESH ERAS PATHWAY;  Surgeon: Donnie Mesa, MD;  Location: [place] [facility];  Service: General;  Laterality: N/A;  . LAPAROTOMY N/A 01/13/2018   Procedure: EXPLORATORY LAPAROTOMY AND EXTENDED RIGHT HEMICOLECTOMY;  Surgeon: Donnie Mesa, MD;  Location: [place];  Service: General;  Laterality: N/A;  . MELANOMA EXCISION Right    "arm; near shoulder"  . RHINOPLASTY  1994  . UMBILICAL HERNIA REPAIR      Current Outpatient Medications  Medication Sig Dispense Refill  . estradiol (VIVELLE-DOT) 0.05 MG/24HR patch Place 1 patch onto the skin every 3 (three) days.  1  . Estradiol 10 MCG TABS vaginal tablet Place 1 tablet vaginally  2 (two) times a week. For 5 weeks    . nortriptyline (PAMELOR) 10 MG capsule Take 2 capsules (20 mg total) by mouth at bedtime. 180 capsule 4  . progesterone (PROMETRIUM) 100 MG capsule Take 200 mg by mouth at bedtime.  2  . rizatriptan (MAXALT) 10 MG tablet TAKE 1 TABLET BY MOUTH AS NEEDED FOR MIGRAINE. MAY REPEAT IN 2 HOURS IF NEEDED. MAX 2 TABLETS IN 24 HOURS. 10 tablet 11   No current facility-administered medications for this visit.   Facility-Administered Medications Ordered in Other Visits  Medication Dose Route Frequency Provider Last Rate Last Admin  . gadopentetate dimeglumine (MAGNEVIST) injection 13 mL  13 mL Intravenous Once PRN Melvenia Beam, MD        Allergies as of 08/29/2020  . (No Known Allergies)    Vitals: LMP 01/29/2011  Last Weight:  Wt Readings from Last 1 Encounters:  07/05/20 132 lb 3.2 oz (60 kg)   Last Height:   Ht Readings from Last 1 Encounters:  07/05/20 5\' 9"  (1.753 m)    Speech:    Speech is normal; fluent and spontaneous with normal comprehension.  Cognition:    The patient is oriented to person, place, and time;     recent and remote memory intact;     language fluent;     normal attention, concentration,     fund of  knowledge     Assessment/Plan:  50 year old female with migraines, FHx of migraine with aura in mother. MRI brain normal. Significantly improved on Nortriptyline. Continue.  Meds ordered this encounter  Medications  . nortriptyline (PAMELOR) 10 MG capsule    Sig: Take 2 capsules (20 mg total) by mouth at bedtime.    Dispense:  180 capsule    Refill:  4  . rizatriptan (MAXALT) 10 MG tablet    Sig: TAKE 1 TABLET BY MOUTH AS NEEDED FOR MIGRAINE. MAY REPEAT IN 2 HOURS IF NEEDED. MAX 2 TABLETS IN 24 HOURS.    Dispense:  10 tablet    Refill:  11    Pt needs an appt     Cc: Dr. Marcy Siren, [facility] [address] [place], [place] [address]  Phone [phone] Fax [phone]

## 2020-09-01 ENCOUNTER — Encounter: Payer: BC Managed Care – PPO | Admitting: Plastic Surgery

## 2020-12-24 ENCOUNTER — Encounter: Payer: Self-pay | Admitting: Internal Medicine

## 2021-07-05 ENCOUNTER — Encounter: Payer: Self-pay | Admitting: Internal Medicine

## 2021-07-05 ENCOUNTER — Other Ambulatory Visit: Payer: Self-pay

## 2021-07-05 ENCOUNTER — Ambulatory Visit: Payer: BC Managed Care – PPO | Admitting: Internal Medicine

## 2021-07-05 VITALS — BP 110/68 | HR 95 | Ht 69.0 in | Wt 134.2 lb

## 2021-07-05 DIAGNOSIS — E042 Nontoxic multinodular goiter: Secondary | ICD-10-CM

## 2021-07-05 DIAGNOSIS — E063 Autoimmune thyroiditis: Secondary | ICD-10-CM

## 2021-07-05 NOTE — Progress Notes (Addendum)
Patient ID: Madeline Walker, female   DOB: March 31, 1971, 51 y.o.   MRN: [id number]   This visit occurred during the SARS-CoV-2 public health emergency.  Safety protocols were in place, including screening questions prior to the visit, additional usage of staff PPE, and extensive cleaning of exam room while observing appropriate contact time as indicated for disinfecting solutions.   HPI  Madeline Walker is a 51 y.o.-year-old female, initially referred by Leighton Ruff, MD, returning for follow-up for thyroid nodules.  Since last visit, she had an elevated antithyroglobulin antibody level, therefore a new diagnosis of Hashimoto's thyroiditis.  Interim history: Patient continues to experience left upper cervical pressure, similar to 2 years ago (see below). Since last visit, she continued to see her integrative medicine provider who checked her thyroid tests and antibodies in 11/2020 and started her on Cytomel 2 x5 mcg tablets daily as her ATA antibodies are positive. She did not feel different after starting the thyroid hormone.  No palpitations, tremors, heat intolerance, weight loss. She continues on Cytomel.  Reviewed history: At our initial visit, patient patient described that she noticed swelling in her L neck radiating to the ear 1 month prior to the appointment and presented to see her PCP.  At that time, she was sent for a thyroid ultrasound.  This showed a slightly enlarged right thyroid lobe and 2 thyroid nodules. On the ultrasound, it appeared that the area of concern was a normal-appearing lymph node.  She started: Turmeric, Boswellia, Ibuprofen, Ashwagandha, Frankinsense and Lemongrass essential oils, black seed oil.  The inflammation on the left side of her neck decreased in size, but she is still feeling occasional pain in her ear.  She was referred to endocrinology for further evaluation of her thyroid nodules.  Thyroid U/S (06/21/2020): Right thyroid lobe larger than the left, 2  thyroid nodules in the right lobe:  Parenchymal Echotexture: Mildly heterogenous Isthmus: 0.2 cm thickness Right lobe: 6.5 x 1.5 x 1.8 cm Left lobe: 4.4 x 1.2 x 1.4 cm _________________________________________________________   Nodule # 1: Location: Right; Superior Maximum size: 0.9 cm; Other 2 dimensions: 0.7 x 0.6 cm Composition: cannot determine (2) Echogenicity: hypoechoic (2) Shape: not taller-than-wide (0) Margins: ill-defined (0) Echogenic foci: punctate echogenic foci (3) ACR TI-RADS total points: 7. *Given size (>/= 0.5 - 0.9 cm) and appearance, a follow-up ultrasound in 1 year should be considered based on TI-RADS criteria.  _________________________________________________________   Nodule # 2: Location: Right; Mid Maximum size: 1.5 cm; Other 2 dimensions:   1.2 x 0.9 cm Composition: mixed cystic and solid (1) Echogenicity: hypoechoic (2) Shape: not taller-than-wide (0) Margins: ill-defined (0) Echogenic foci: none (0) ACR TI-RADS total points: 3. *Given size (>/= 1.5 - 2.4 cm) and appearance, a follow-up ultrasound in 1 year should be considered based on TI-RADS criteria.  _________________________________________________________   Morphologically unremarkable cervical lymph nodes bilaterally, all less than 0.5 mm are noted. There is a 0.4 cm node superior to the thyroid corresponding to palpable area of concern.   IMPRESSION: 1. Mild thyromegaly with  nodules.  None meets criteria for biopsy. 2. Recommend annual/biennial ultrasound follow-up of right nodules as above, until stability x5 years confirmed. 3. Morphologically unremarkable 0.4 cm lymph node in palpable region of concern.        Pt denies: - feeling nodules in neck - hoarseness - dysphagia - choking  Reviewed her TFTs: 04/25/2021: TSH 0.798, fT4 0.95, fT3 3.1, ATA Abs <1, TPO Abs 11 - on LT3 10 mcg daily  12/22/2020: TSH 1.18 (0.45-4.5), fT4 1.24 (0.82-1.77), fT3 2.2 (2.0-4.4), ATA Abs  5.1 (<0.9), TPO Abs 10 (<34), TRAb 1.66 (<1.75) - started LT3 10 mcg daily Lab Results  Component Value Date   TSH 0.69 07/05/2020   FREET4 1.03 07/05/2020   Component     Latest Ref Rng & Units 07/05/2020  Thyroglobulin Ab     < or = 1 IU/mL <1  Thyroperoxidase Ab SerPl-aCnc     <9 IU/mL 2   06/17/2020: TSH 0.67 (0.34-4.5)  No FH of thyroid ds. No FH of thyroid cancer. No h/o radiation tx to head or neck.  No steroid use. No herbal supplements. No Biotin supplements or Hair, Skin and Nails vitamins.  Of note, she went through menopause early, at 51 y/o (POF), and she was started on estradiol transdermal patch, bioidentical progesterone and testosterone compounded cream - she gets these from [employer].  She is also on a supplement to control estrogen, which contains 600 mcg iodine (400% of the rec'd TDD)  She has a history of a miscarriage in 2010, anxiety, BPPV, h/o volvulus sx 2019, followed by abdominal hernia resection with mesh placement, with postsx hiatal hernia and GERD.  ROS: + see HPI  Past Medical History:  Diagnosis Date   BPPV (benign paroxysmal positional vertigo)    [facility] ENT   Breast cyst    Chest pain    Fibrocystic breast    History of chicken pox    Infertility, female    Melanoma ([place])    "right upper arm/shoulder"   Migraine    "under control w/daily RX; still get one ~ q month" (01/13/2018)   Ovarian cyst    Palpitations    Premature ovarian failure    Yeast infection    Past Surgical History:  Procedure Laterality Date   ABDOMINOPLASTY  2015   CESAREAN SECTION  2002   CESAREAN SECTION  10/31/2011   Procedure: CESAREAN SECTION;  Surgeon: Eldred Manges, MD;  Location: [facility];  Service: Gynecology;  Laterality: N/A;   COLON SURGERY  01/13/2018   EXPLORATORY LAPAROTOMY AND EXTENDED RIGHT HEMICOLECTOMY   DILATION AND CURETTAGE OF UTERUS  2010   SAB   HERNIA REPAIR     INCISIONAL HERNIA REPAIR N/A 06/13/2018    Procedure: VENTRAL INCISIONAL HERNIA REPAIR WITH  MESH ERAS PATHWAY;  Surgeon: Donnie Mesa, MD;  Location: [facility];  Service: General;  Laterality: N/A;   LAPAROTOMY N/A 01/13/2018   Procedure: EXPLORATORY LAPAROTOMY AND EXTENDED RIGHT HEMICOLECTOMY;  Surgeon: Donnie Mesa, MD;  Location: [place];  Service: General;  Laterality: N/A;   MELANOMA EXCISION Right    "arm; near shoulder"   RHINOPLASTY  9485   UMBILICAL HERNIA REPAIR     Social History   Socioeconomic History   Marital status: Married    Spouse name: Not on file   Number of children: 2   Years of education: 16   Highest education level: Master's degree (e.g., MA, MS, MEng, MEd, MSW, MBA)  Occupational History   Not on file  Tobacco Use   Smoking status: Never   Smokeless tobacco: Never  Vaping Use   Vaping Use: Never used  Substance and Sexual Activity   Alcohol use: Yes    Alcohol/week: 1.0 standard drink    Types: 1 Standard drinks or equivalent per week   Drug use: Not Currently   Sexual activity: Yes    Comment: menopause  Other Topics Concern  Not on file  Social History Narrative   Lives at home with husband and children   Right handed   1/2 cup coffee in the morning   Social Determinants of Health   Financial Resource Strain: Not on file  Food Insecurity: Not on file  Transportation Needs: Not on file  Physical Activity: Not on file  Stress: Not on file  Social Connections: Not on file  Intimate Partner Violence: Not on file   Current Outpatient Medications on File Prior to Visit  Medication Sig Dispense Refill   estradiol (VIVELLE-DOT) 0.05 MG/24HR patch Place 1 patch onto the skin every 3 (three) days.  1   Estradiol 10 MCG TABS vaginal tablet Place 1 tablet vaginally 2 (two) times a week. For 5 weeks     nortriptyline (PAMELOR) 10 MG capsule Take 2 capsules (20 mg total) by mouth at bedtime. 180 capsule 4   progesterone (PROMETRIUM) 100 MG capsule Take 200 mg by mouth at  bedtime.  2   rizatriptan (MAXALT) 10 MG tablet TAKE 1 TABLET BY MOUTH AS NEEDED FOR MIGRAINE. MAY REPEAT IN 2 HOURS IF NEEDED. MAX 2 TABLETS IN 24 HOURS. 10 tablet 11   Current Facility-Administered Medications on File Prior to Visit  Medication Dose Route Frequency Provider Last Rate Last Admin   gadopentetate dimeglumine (MAGNEVIST) injection 13 mL  13 mL Intravenous Once PRN Melvenia Beam, MD       No Known Allergies Family History  Problem Relation Age of Onset   COPD Mother    Mental illness Mother        DEPRESSION AND SUICIDE   Migraines Mother    Hypertension Father    Cancer Father        KIDNEY AND PROSTATE   Anemia Sister    Autism Son    Cancer Maternal Uncle        LUNG   Rheum arthritis Maternal Grandmother    Diabetes Maternal Grandfather        TYPE I   Cancer Paternal Grandfather        colon   PE: BP 110/68 (BP Location: Right Arm, Patient Position: Sitting, Cuff Size: Normal)    Pulse 95    Ht 5\' 9"  (1.753 m)    Wt 134 lb 3.2 oz (60.9 kg)    LMP 01/29/2011    SpO2 98%    BMI 19.82 kg/m  Wt Readings from Last 3 Encounters:  07/05/21 134 lb 3.2 oz (60.9 kg)  07/05/20 132 lb 3.2 oz (60 kg)  06/16/20 135 lb 9.6 oz (61.5 kg)   Constitutional: normal weight, in NAD Eyes: PERRLA, EOMI, no exophthalmos ENT: moist mucous membranes, no thyromegaly, but thyroid visible with deglutition, no thyroid nodules palpated, no cervical lymphadenopathy but fullness palpated on the right side of the neck Cardiovascular: Tachycardia, RR, No MRG Respiratory: CTA B Musculoskeletal: no deformities, strength intact in all 4;  Skin: moist, warm, no rashes Neurological: no tremor with outstretched hands, DTR normal in all 4  ASSESSMENT: 1.  Multiple thyroid nodules  2.  Enlarged lymph node  3.  Hashimoto's thyroiditis  PLAN: 1.  Multiple thyroid nodules -Patient with history of left-sided anterior neck swelling, found to be related to a normal-appearing lymph node on  her ultrasound from 06/2020.  However, during the investigation for this, she was found to have an enlarged right thyroid lobe and 2 right thyroid nodules.  Per review of the ultrasound images, the nodules were not large, they  were mostly hypoechoic, without internal blood flow, more wide than tall and without invasion of surrounding tissues.  They did contain hyperechoic foci, difficult to determine whether these were microcalcifications or colloid crystals.  Some of these particles did have a slight comet tail, pointing towards benignity.  The largest nodule contains a significant amount of fluid.  Patient does not have a family history of thyroid cancer or personal history of radiation therapy to head or neck to increase her risk for thyroid cancer. -At last visit we discussed about checking another thyroid ultrasound in 1 year and biopsy the nodules if they appear to increase in size or change ultrasound characteristics.  We will order this ultrasound at today's visit.  2.  Enlarged lymph node -A normal-sized lymph node was seen on her ultrasound from 06/2020 corresponding to the area of pressure in the left upper area of her neck.  We discussed about possible causes for enlarged lymph nodes at last visit, including inflammation and infection. -She continues to have pressure in the area and I would like to reevaluate this on the knee ultrasound. -If the lymph node appeared to be enlarged or worrisome, she may need a referral to ENT/Bx  3.  Hashimoto's thyroiditis -At last visit, we tested her antithyroid antibodies and they were normal.  All of her TFTs were also normal.   -However, since last visit, she had a recheck of the above tests by her integrative medicine provider and one of the thyroid antibodies, the antithyroglobulin Ab, was found to be elevated.  Her TSH was excellent, at 1.18, but free T4 and free T3 hormones were normal.  It is unclear to me why she was started on Cytomel 10 mcg daily...  We discussed about the negative effect of exogenous thyroid hormone administration in the setting of normal TFTs.  Thankfully, she was started on a relatively low dose (the equivalent levothyroxine dose would be 40 mcg daily).  Her TSH did not suppress, only decreased to lower levels on this regimen as per the last check at the end of 04/2021 (TSH 0.798).  At today's exam, she is tachycardic, which could be related to the Cytomel intake. -At today's visit, I strongly advised her to come off the Cytomel to avoid further suppression of her own thyroid function.  We discussed about the negative effects of continuing this.  We will decrease the dose to 5 mcg daily for a week, then 2.5 mcg daily for another week, and then stop.  After stopping completely, we will wait 4-5 weeks and then recheck her thyroid function. -I will see her back in 6 months but will stay in touch about her labs until then.  Thyroid U/S (07/24/2021): Parenchymal Echotexture: Mildly heterogenous  Isthmus: 0.2 cm  Right lobe: 5.9 x 1.4 x 1.7 cm  Left lobe: 4.3 x 0.9 x 1.0 cm  _________________________________________________________   Nodule labeled 1 is a solid hypoechoic nodule with punctate echogenic foci (TR 5) in the superior right thyroid lobe that measures 0.9 x 0.8 x 0.6 cm, previously measuring 0.9 cm. It remains similar in size and morphology.   Nodule labeled 3 (previously 2) there is a mixed cystic and solid nodule with hypoechoic solid areas that demonstrate punctate echogenic foci (TR 4) in the mid right thyroid lobe that measures 1.8 x 1.1 x 1.0 cm, slightly intervally increased in size partially due to increased cystic area. **Given size (>/= 1.5 cm) and appearance, fine needle aspiration of this moderately suspicious nodule should  be considered based on TI-RADS criteria.   IMPRESSION: 1. Nodule labeled 3 (previously 2) in the mid right thyroid lobe demonstrates slight increase in size and more conspicuous  suspicious features on today's exam (1.8 cm TR 4, previously 1.5 cm TR 3). This nodule meets criteria for biopsy. 2. Nodule labeled 1 demonstrates stable size and appearance (0.9 cm TR 5), and continues to meet criteria for follow-up ultrasound in 1 year. This exam marks 1 year stability.   The above is in keeping with the ACR TI-RADS recommendations - J Am Coll Radiol 2017;14:587-595.    Will recommend FNA of the right mid thyroid nodule.  Will discuss with the patient.  FNA (08/16/2021): Clinical History: Nodule labeled 3, previously 2) there is a mixed  cystic and solid nodule with hypoechoic solid areas that demonstrate  punctate echogenic foci (TR 4) in the mid right thyroid lobe that  measures 1.8 x 1.1 x 1.0 cm, slightly intervally increased in size part  Specimen Submitted:  A. THYROID, RIGHT LOBE, FINE NEEDLE ASPIRATION:   FINAL MICROSCOPIC DIAGNOSIS:  - Consistent with benign follicular nodule (Bethesda category II)   SPECIMEN ADEQUACY:  Satisfactory for evaluation   Philemon Kingdom, MD PhD [facility]

## 2021-07-05 NOTE — Patient Instructions (Addendum)
Please decrease Cytomel to 5 mcg daily x 1 week, then 2.5 mcg daily for 1 week, then stop.  Come back for labs in 4-5 weeks.  We will schedule a new Thyroid U/S.  Please come back for a follow-up appointment in 6 months.

## 2021-07-12 ENCOUNTER — Other Ambulatory Visit: Payer: BC Managed Care – PPO

## 2021-07-24 ENCOUNTER — Ambulatory Visit
Admission: RE | Admit: 2021-07-24 | Discharge: 2021-07-24 | Disposition: A | Discharge status: Home / Self Care | Payer: BC Managed Care – PPO | Source: Ambulatory Visit | Attending: Internal Medicine | Admitting: Internal Medicine

## 2021-07-24 DIAGNOSIS — E042 Nontoxic multinodular goiter: Secondary | ICD-10-CM

## 2021-07-26 ENCOUNTER — Encounter: Payer: Self-pay | Admitting: Internal Medicine

## 2021-07-26 ENCOUNTER — Other Ambulatory Visit: Payer: Self-pay | Admitting: Internal Medicine

## 2021-07-26 DIAGNOSIS — E042 Nontoxic multinodular goiter: Secondary | ICD-10-CM

## 2021-08-15 ENCOUNTER — Other Ambulatory Visit (INDEPENDENT_AMBULATORY_CARE_PROVIDER_SITE_OTHER): Payer: BC Managed Care – PPO

## 2021-08-15 ENCOUNTER — Other Ambulatory Visit: Payer: Self-pay

## 2021-08-15 DIAGNOSIS — E063 Autoimmune thyroiditis: Secondary | ICD-10-CM

## 2021-08-16 ENCOUNTER — Other Ambulatory Visit (HOSPITAL_COMMUNITY)
Admission: RE | Admit: 2021-08-16 | Discharge: 2021-08-16 | Disposition: A | Discharge status: Home / Self Care | Payer: BC Managed Care – PPO | Source: Ambulatory Visit | Attending: Internal Medicine | Admitting: Internal Medicine

## 2021-08-16 ENCOUNTER — Ambulatory Visit
Admission: RE | Admit: 2021-08-16 | Discharge: 2021-08-16 | Disposition: A | Discharge status: Home / Self Care | Payer: BC Managed Care – PPO | Source: Ambulatory Visit | Attending: Internal Medicine | Admitting: Internal Medicine

## 2021-08-16 DIAGNOSIS — E041 Nontoxic single thyroid nodule: Secondary | ICD-10-CM | POA: Diagnosis present

## 2021-08-16 DIAGNOSIS — D34 Benign neoplasm of thyroid gland: Secondary | ICD-10-CM | POA: Insufficient documentation

## 2021-08-16 DIAGNOSIS — E042 Nontoxic multinodular goiter: Secondary | ICD-10-CM

## 2021-08-16 LAB — T3, FREE: T3, Free: 2.7 pg/mL (ref 2.3–4.2)

## 2021-08-16 LAB — T4, FREE: Free T4: 0.86 ng/dL (ref 0.60–1.60)

## 2021-08-16 LAB — TSH: TSH: 1.16 u[IU]/mL (ref 0.35–5.50)

## 2021-08-17 LAB — CYTOLOGY - NON PAP

## 2021-08-17 NOTE — Addendum Note (Signed)
Addended by: Philemon Kingdom on: 08/17/2021 04:28 PM   Modules accepted: Orders

## 2021-09-27 ENCOUNTER — Other Ambulatory Visit: Payer: Self-pay | Admitting: Neurology

## 2021-10-28 ENCOUNTER — Other Ambulatory Visit: Payer: Self-pay | Admitting: Neurology

## 2021-11-01 ENCOUNTER — Other Ambulatory Visit: Payer: Self-pay | Admitting: Neurology

## 2021-12-18 ENCOUNTER — Encounter: Payer: Self-pay | Admitting: Neurology

## 2021-12-18 ENCOUNTER — Other Ambulatory Visit: Payer: Self-pay | Admitting: *Deleted

## 2021-12-18 ENCOUNTER — Telehealth: Payer: Self-pay | Admitting: Neurology

## 2021-12-18 MED ORDER — NORTRIPTYLINE HCL 10 MG PO CAPS: 20.0000 mg | ORAL_CAPSULE | Freq: Every day | ORAL | 1 refills | Status: DC

## 2021-12-18 NOTE — Telephone Encounter (Signed)
Pt is requesting a refill for nortriptyline (PAMELOR) 10 MG capsule.  Pharmacy: [id number]

## 2021-12-18 NOTE — Telephone Encounter (Signed)
..   Pt understands that although there may be some limitations with this type of visit, we will take all precautions to reduce any security or privacy concerns.  Pt understands that this will be treated like an in office visit and we will file with pt's insurance, and there may be a patient responsible charge related to this service. ? ?

## 2022-01-03 ENCOUNTER — Encounter: Payer: Self-pay | Admitting: Internal Medicine

## 2022-01-03 ENCOUNTER — Ambulatory Visit: Payer: BC Managed Care – PPO | Admitting: Internal Medicine

## 2022-01-03 VITALS — BP 110/78 | HR 78 | Ht 69.0 in | Wt 137.2 lb

## 2022-01-03 DIAGNOSIS — E042 Nontoxic multinodular goiter: Secondary | ICD-10-CM

## 2022-01-03 DIAGNOSIS — E063 Autoimmune thyroiditis: Secondary | ICD-10-CM

## 2022-01-03 NOTE — Patient Instructions (Addendum)
Try to use Selenium 200 mcg daily or 4 Brazilian nuts.  Send me a message in 07/2022 to order a new thyroid U/S.  Please come back for an appt in 08/2022.

## 2022-01-03 NOTE — Progress Notes (Signed)
Patient ID: Madeline Walker, female   DOB: 03-30-1971, 51 y.o.   MRN: [id number]   HPI  Madeline Walker is a 51 y.o.-year-old female, initially referred by No ref. provider found, returning for follow-up for thyroid nodules and Hashimoto's thyroiditis. Last visit 6 months ago.  Interim history: Patient continues to experience left mid cervical pressure, similar to 2 years ago (see below). Before last visit, she continued to see her integrative medicine provider who checked her thyroid tests and antibodies in 11/2020 and started her on Cytomel 2 x5 mcg tablets daily as her ATA antibodies were positive.  We tapered this to off.  Her integrative medicine provider continues to insist that she starts treatment with thyroid hormones despite normal TFTs.  Reviewed and addended history: At our initial visit, patient patient described that she noticed swelling in her L neck radiating to the ear 1 month prior to the appointment and presented to see her PCP.  At that time, she was sent for a thyroid ultrasound.  This showed a slightly enlarged right thyroid lobe and 2 thyroid nodules. On the ultrasound, it appeared that the area of concern was a normal-appearing lymph node.  She started: Turmeric, Boswellia, Ibuprofen, Ashwagandha, Frankinsense and Lemongrass essential oils, black seed oil.  The inflammation on the left side of her neck decreased in size, but she is still feeling occasional pain in her ear.  She was referred to endocrinology for further evaluation of her thyroid nodules.  In 2022, she saw ENT who evaluated her vocal cords (she also has hoarseness) and this exam was normal.  Thyroid U/S (06/21/2020): Right thyroid lobe larger than the left, 2 thyroid nodules in the right lobe:  Parenchymal Echotexture: Mildly heterogenous Isthmus: 0.2 cm thickness Right lobe: 6.5 x 1.5 x 1.8 cm Left lobe: 4.4 x 1.2 x 1.4 cm _________________________________________________________   Nodule # 1: Location:  Right; Superior Maximum size: 0.9 cm; Other 2 dimensions: 0.7 x 0.6 cm Composition: cannot determine (2) Echogenicity: hypoechoic (2) Shape: not taller-than-wide (0) Margins: ill-defined (0) Echogenic foci: punctate echogenic foci (3) ACR TI-RADS total points: 7. *Given size (>/= 0.5 - 0.9 cm) and appearance, a follow-up ultrasound in 1 year should be considered based on TI-RADS criteria.  _________________________________________________________   Nodule # 2: Location: Right; Mid Maximum size: 1.5 cm; Other 2 dimensions:   1.2 x 0.9 cm Composition: mixed cystic and solid (1) Echogenicity: hypoechoic (2) Shape: not taller-than-wide (0) Margins: ill-defined (0) Echogenic foci: none (0) ACR TI-RADS total points: 3. *Given size (>/= 1.5 - 2.4 cm) and appearance, a follow-up ultrasound in 1 year should be considered based on TI-RADS criteria.  _________________________________________________________   Morphologically unremarkable cervical lymph nodes bilaterally, all less than 0.5 mm are noted. There is a 0.4 cm node superior to the thyroid corresponding to palpable area of concern.   IMPRESSION: 1. Mild thyromegaly with  nodules.  None meets criteria for biopsy. 2. Recommend annual/biennial ultrasound follow-up of right nodules as above, until stability x5 years confirmed. 3. Morphologically unremarkable 0.4 cm lymph node in palpable region of concern.        Thyroid U/S (07/24/2021): Parenchymal Echotexture: Mildly heterogenous  Isthmus: 0.2 cm  Right lobe: 5.9 x 1.4 x 1.7 cm  Left lobe: 4.3 x 0.9 x 1.0 cm  _________________________________________________________   Nodule labeled 1 is a solid hypoechoic nodule with punctate echogenic foci (TR 5) in the superior right thyroid lobe that measures 0.9 x 0.8 x 0.6 cm, previously measuring 0.9 cm. It  remains similar in size and morphology.   Nodule labeled 3 (previously 2) there is a mixed cystic and solid nodule with  hypoechoic solid areas that demonstrate punctate echogenic foci (TR 4) in the mid right thyroid lobe that measures 1.8 x 1.1 x 1.0 cm, slightly intervally increased in size partially due to increased cystic area. **Given size (>/= 1.5 cm) and appearance, fine needle aspiration of this moderately suspicious nodule should be considered based on TI-RADS criteria.   IMPRESSION: 1. Nodule labeled 3 (previously 2) in the mid right thyroid lobe demonstrates slight increase in size and more conspicuous suspicious features on today's exam (1.8 cm TR 4, previously 1.5 cm TR 3). This nodule meets criteria for biopsy. 2. Nodule labeled 1 demonstrates stable size and appearance (0.9 cm TR 5), and continues to meet criteria for follow-up ultrasound in 1 year. This exam marks 1 year stability.   FNA (08/16/2021): Clinical History: Nodule labeled 3, previously 2) there is a mixed  cystic and solid nodule with hypoechoic solid areas that demonstrate  punctate echogenic foci (TR 4) in the mid right thyroid lobe that  measures 1.8 x 1.1 x 1.0 cm, slightly intervally increased in size part  Specimen Submitted:  A. THYROID, RIGHT LOBE, FINE NEEDLE ASPIRATION:   FINAL MICROSCOPIC DIAGNOSIS:  - Consistent with benign follicular nodule (Bethesda category II)   SPECIMEN ADEQUACY:  Satisfactory for evaluation   Pt denies: - feeling nodules in neck - hoarseness - dysphagia - choking  She is now off Cytomel, stopped 07/2021.  Reviewed her TFTs: 11/29/2021: TSH 1.84, free T3 2.6 (2-4.4), free T4 0.99 (0.82-1.77), TPO antibodies 19 (0-34), ATA antibodies <1.0.  She had further labs, which will be scanned. Lab Results  Component Value Date   TSH 1.16 08/15/2021   TSH 0.69 07/05/2020   FREET4 0.86 08/15/2021   FREET4 1.03 07/05/2020  04/25/2021: TSH 0.798, fT4 0.95, fT3 3.1, ATA Abs <1, TPO Abs 11 - on LT3 10 mcg daily 12/22/2020: TSH 1.18 (0.45-4.5), fT4 1.24 (0.82-1.77), fT3 2.2 (2.0-4.4), ATA Abs 5.1  (<0.9), TPO Abs 10 (<34), TRAb 1.66 (<1.75) - started LT3 10 mcg daily  Component     Latest Ref Rng & Units 07/05/2020  Thyroglobulin Ab     < or = 1 IU/mL <1  Thyroperoxidase Ab SerPl-aCnc     <9 IU/mL 2   06/17/2020: TSH 0.67 (0.34-4.5)  No FH of thyroid ds. No FH of thyroid cancer. No h/o radiation tx to head or neck. No steroid use. No herbal supplements. No Biotin supplements or Hair, Skin and Nails vitamins.  Of note, she went through menopause early, at 51 y/o (POF), and she was started on estradiol transdermal patch, bioidentical progesterone and testosterone compounded cream - she gets these from [employer].  She is also on a supplement to control estrogen, which contains 600 mcg iodine (400% of the rec'd TDD)  She has a history of a miscarriage in 2010, anxiety, BPPV, h/o volvulus sx 2019, followed by abdominal hernia resection with mesh placement, with postsx hiatal hernia and GERD.  ROS: + see HPI  Past Medical History:  Diagnosis Date   BPPV (benign paroxysmal positional vertigo)    [facility] ENT   Breast cyst    Chest pain    Fibrocystic breast    History of chicken pox    Infertility, female    Melanoma ([place])    "right upper arm/shoulder"   Migraine    "under control w/daily RX;  still get one ~ q month" (01/13/2018)   Ovarian cyst    Palpitations    Premature ovarian failure    Yeast infection    Past Surgical History:  Procedure Laterality Date   ABDOMINOPLASTY  2015   CESAREAN SECTION  2002   CESAREAN SECTION  10/31/2011   Procedure: CESAREAN SECTION;  Surgeon: Eldred Manges, MD;  Location: [facility];  Service: Gynecology;  Laterality: N/A;   COLON SURGERY  01/13/2018   EXPLORATORY LAPAROTOMY AND EXTENDED RIGHT HEMICOLECTOMY   DILATION AND CURETTAGE OF UTERUS  2010   SAB   HERNIA REPAIR     INCISIONAL HERNIA REPAIR N/A 06/13/2018   Procedure: VENTRAL INCISIONAL HERNIA REPAIR WITH  MESH ERAS PATHWAY;  Surgeon: Donnie Mesa,  MD;  Location: [facility];  Service: General;  Laterality: N/A;   LAPAROTOMY N/A 01/13/2018   Procedure: EXPLORATORY LAPAROTOMY AND EXTENDED RIGHT HEMICOLECTOMY;  Surgeon: Donnie Mesa, MD;  Location: [facility];  Service: General;  Laterality: N/A;   MELANOMA EXCISION Right    "arm; near shoulder"   RHINOPLASTY  3646   UMBILICAL HERNIA REPAIR     Social History   Socioeconomic History   Marital status: Married    Spouse name: Not on file   Number of children: 2   Years of education: 16   Highest education level: Master's degree (e.g., MA, MS, MEng, MEd, MSW, MBA)  Occupational History   Not on file  Tobacco Use   Smoking status: Never   Smokeless tobacco: Never  Vaping Use   Vaping Use: Never used  Substance and Sexual Activity   Alcohol use: Yes    Alcohol/week: 1.0 standard drink of alcohol    Types: 1 Standard drinks or equivalent per week   Drug use: Not Currently   Sexual activity: Yes    Comment: menopause  Other Topics Concern   Not on file  Social History Narrative   Lives at home with husband and children   Right handed   1/2 cup coffee in the morning   Social Determinants of Health   Financial Resource Strain: Not on file  Food Insecurity: Not on file  Transportation Needs: Not on file  Physical Activity: Not on file  Stress: Not on file  Social Connections: Not on file  Intimate Partner Violence: Not on file   Current Outpatient Medications on File Prior to Visit  Medication Sig Dispense Refill   estradiol (VIVELLE-DOT) 0.05 MG/24HR patch Place 1 patch onto the skin every 3 (three) days.  1   nortriptyline (PAMELOR) 10 MG capsule Take 2 capsules (20 mg total) by mouth at bedtime. MUST BE SEEN FOR FURTHER REFILLS, CALL [phone]. 60 capsule 1   progesterone (PROMETRIUM) 100 MG capsule Take 200 mg by mouth at bedtime.  2   rizatriptan (MAXALT) 10 MG tablet TAKE 1 TABLET BY MOUTH AS NEEDED FOR MIGRAINE. MAY REPEAT IN 2 HOURS IF NEEDED. MAX 2  TABLETS IN 24 HOURS. 10 tablet 11   Current Facility-Administered Medications on File Prior to Visit  Medication Dose Route Frequency Provider Last Rate Last Admin   gadopentetate dimeglumine (MAGNEVIST) injection 13 mL  13 mL Intravenous Once PRN Melvenia Beam, MD       No Known Allergies Family History  Problem Relation Age of Onset   COPD Mother    Mental illness Mother        DEPRESSION AND SUICIDE   Migraines Mother    Hypertension Father  Cancer Father        KIDNEY AND PROSTATE   Anemia Sister    Autism Son    Cancer Maternal Uncle        LUNG   Rheum arthritis Maternal Grandmother    Diabetes Maternal Grandfather        TYPE I   Cancer Paternal Grandfather        colon   PE: LMP 01/29/2011  Wt Readings from Last 3 Encounters:  07/05/21 134 lb 3.2 oz (60.9 kg)  07/05/20 132 lb 3.2 oz (60 kg)  06/16/20 135 lb 9.6 oz (61.5 kg)   Constitutional: normal weight, in NAD Eyes: PERRLA, EOMI, no exophthalmos ENT: moist mucous membranes, no thyromegaly, but thyroid visible with deglutition, no thyroid nodules palpated, no cervical lymphadenopathy but fullness palpated on the right side of the neck Cardiovascular: RRR, No MRG Respiratory: CTA B Musculoskeletal: no deformities, strength intact in all 4;  Skin: moist, warm, no rashes Neurological: no tremor with outstretched hands, DTR normal in all 4  ASSESSMENT: 1.  Multiple thyroid nodules  2.  Enlarged lymph node  3.  Hashimoto's thyroiditis  PLAN: 1.  Multiple thyroid nodules - Patient with history of left-sided anterior neck swelling, found to be related to a normal-appearing lymph node on her ultrasound from 06/2020.  However, during investigation for this, she was found to have an enlarged right thyroid lobe and 2 right thyroid nodules.  Per review of the ultrasound images, the nodules were not large, they were mostly hypoechoic, without internal blood flow, more wide than tall, and without invasion of  surrounding tissues.  They did contain hyperechoic foci, difficult to determine whether these were microcalcifications or colloid crystals.  Some of the particles did have a slight comet tail, pointing towards benignity.  The largest nodule contains a significant amount of fluid. - After last visit, we repeated her thyroid ultrasound (07/24/2021): The mid right thyroid nodule was mixed, cystic/solid and measured 1.8 x 1.1 x 1.0 cm.  We biopsied this nodule on 08/16/2021 and the results were benign.  A follow-up ultrasound in a year was recommended for the other nodule -Of note, she has no family history of thyroid cancer or personal history of radiation therapy to head or neck to increase her risk for thyroid cancer -Plan to repeat her ultrasound a year from the previous -I advised her to contact me so that we can schedule this before our next appointment.  2.  Enlarged lymph node -A normal-sized lymph node was seen on her ultrasound from 06/2020 corresponding to the area of pressure in the left upper area of her neck.  We discussed about possible causes for enlarged lymph nodes at last visit, including inflammation and infection. -There was no mention of an enlarged lymph node on her most recent ultrasound from 07/24/2021 -No neck compression symptoms  3.  Hashimoto's thyroiditis -At last check here in the office, her antithyroid antibodies were normal, however, these were rechecked by her integrative medicine provider and one of the thyroid antibodies, the ATA was found to be elevated.  Her TSH was excellent, at 1.18, and the free T4 and free T3 were normal.  Unclear why she was started on Cytomel 10 mcg daily.  After discussion about the negative effect of unnecessary exogenous thyroid hormone administration, we we decided to taper this to off.  She agreed with the plan.  As of now, she is off Cytomel. -She does not feel differently after stopping Cytomel -She recently  had labs with her integrative  medicine provider and mentions that she was told that her thyroid was low again and he was surprised that she is not tired or has weight gain of thyroid hormones.  Upon review of the labs brought by patient, 1 month ago, TSH was 1.84, excellent, while free T4, free T3, TPO and ATA antibodies were all normal.  We will not recheck these today.  I strongly advised her to stay off any thyroid hormones for now. -However, she still has pressure on the left side of her neck (her nodules are on the right) and we discussed that this may be related to Hashimoto's thyroiditis.  I recommended to try selenium.  She agrees to do so.  Philemon Kingdom, MD PhD [facility] Endocrinology

## 2022-01-11 ENCOUNTER — Other Ambulatory Visit: Payer: Self-pay | Admitting: Neurology

## 2022-01-23 ENCOUNTER — Telehealth: Payer: BC Managed Care – PPO | Admitting: Neurology

## 2022-02-07 ENCOUNTER — Other Ambulatory Visit: Payer: Self-pay | Admitting: Neurology

## 2022-02-11 ENCOUNTER — Other Ambulatory Visit: Payer: Self-pay | Admitting: Neurology

## 2022-07-05 ENCOUNTER — Encounter: Payer: Self-pay | Admitting: Internal Medicine

## 2022-07-05 ENCOUNTER — Other Ambulatory Visit: Payer: Self-pay | Admitting: Internal Medicine

## 2022-07-05 DIAGNOSIS — E042 Nontoxic multinodular goiter: Secondary | ICD-10-CM

## 2022-07-13 ENCOUNTER — Ambulatory Visit
Admission: RE | Admit: 2022-07-13 | Discharge: 2022-07-13 | Disposition: A | Discharge status: Home / Self Care | Payer: BC Managed Care – PPO | Source: Ambulatory Visit | Attending: Internal Medicine | Admitting: Internal Medicine

## 2022-07-13 DIAGNOSIS — E042 Nontoxic multinodular goiter: Secondary | ICD-10-CM

## 2022-08-06 ENCOUNTER — Ambulatory Visit: Payer: BC Managed Care – PPO | Admitting: Internal Medicine

## 2022-08-13 IMAGING — US US THYROID
1 series · 13 of 25 positions shown · non-contrast
Comparison: None.

CLINICAL DATA: Left anterior neck swelling X weeks.

EXAM:
THYROID ULTRASOUND
TECHNIQUE: Ultrasound examination of the thyroid gland and adjacent soft
tissues was performed.

[Series 1: us thyroid · 0.05mm/px · 13 of 68 slices shown]
[im 1/68]
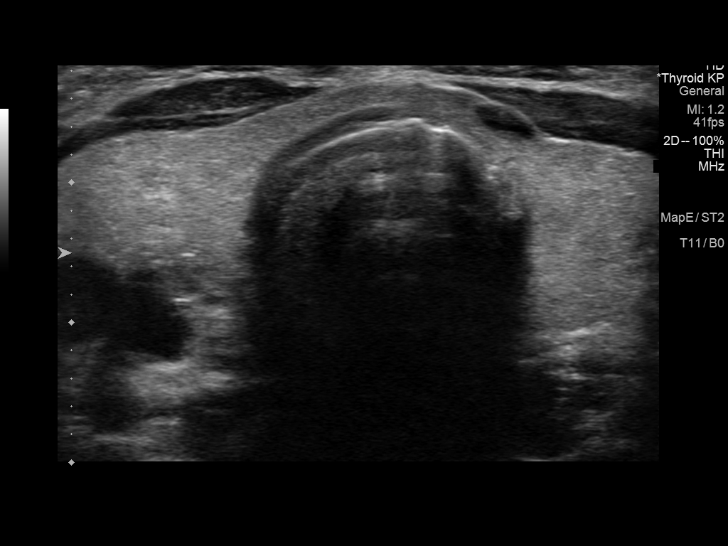
[im 6/68]
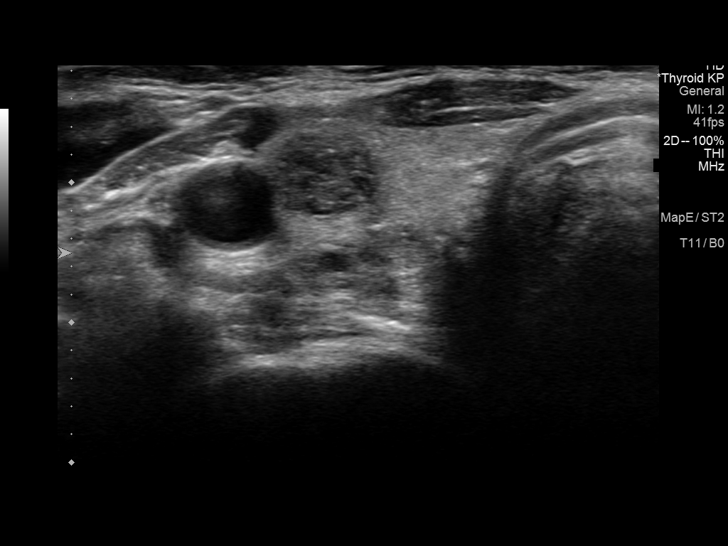
[im 12/68]
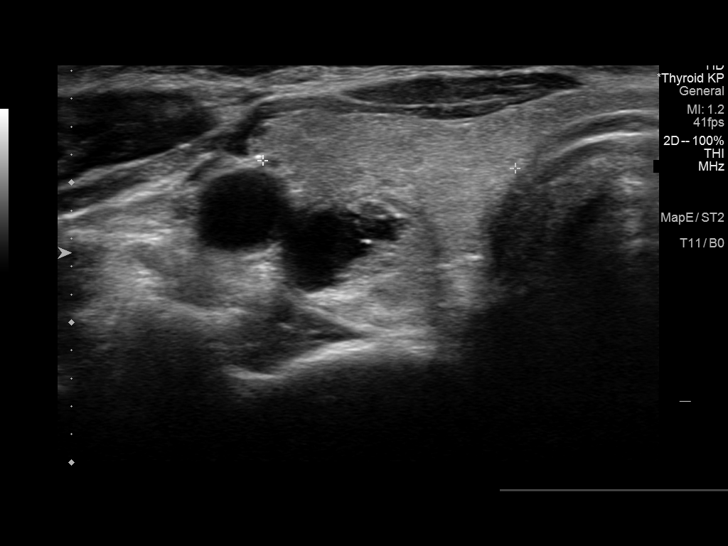
[im 17/68]
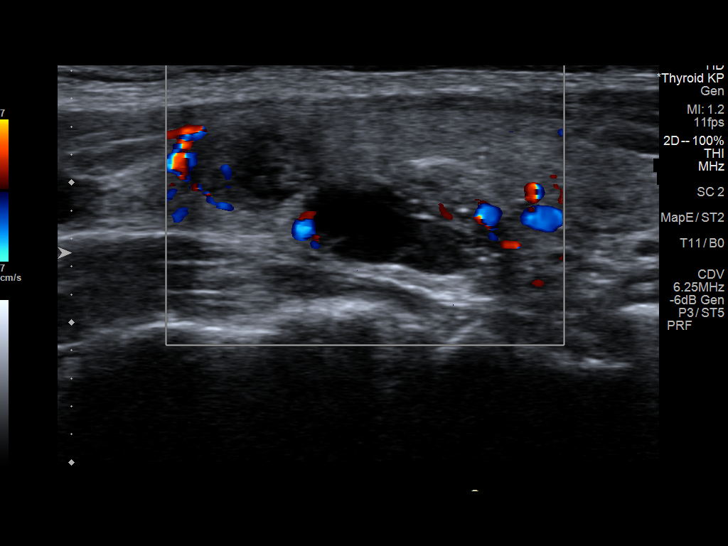
[im 23/68]
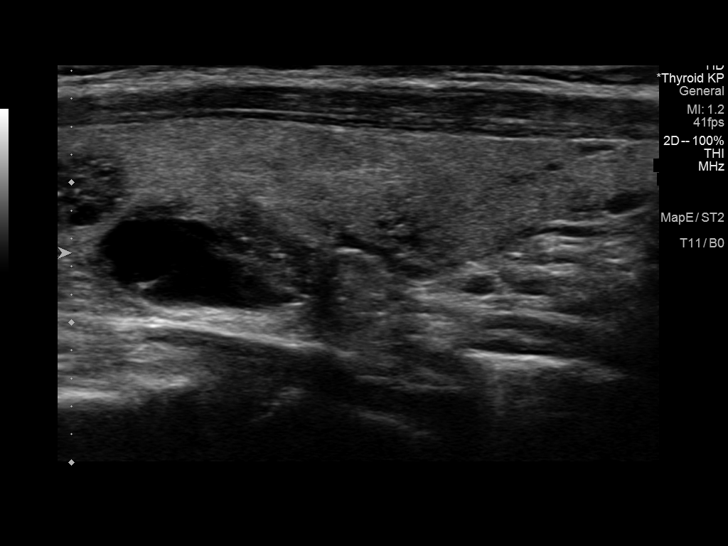
[im 28/68]
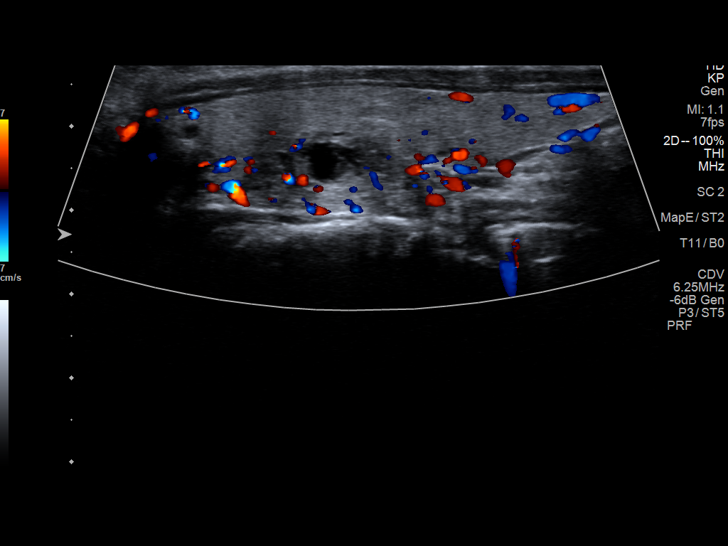
[im 34/68]
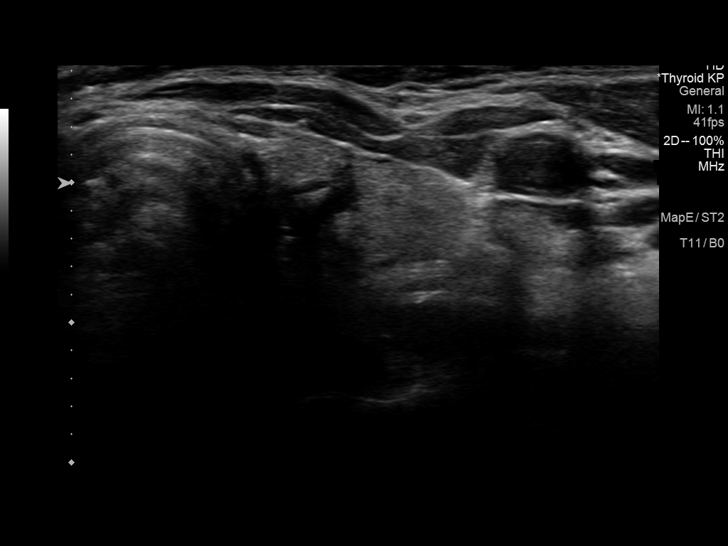
[im 40/68]
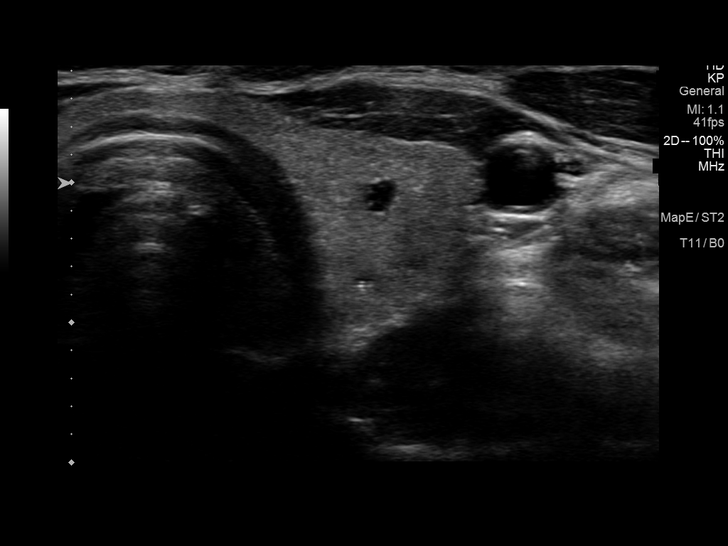
[im 45/68]
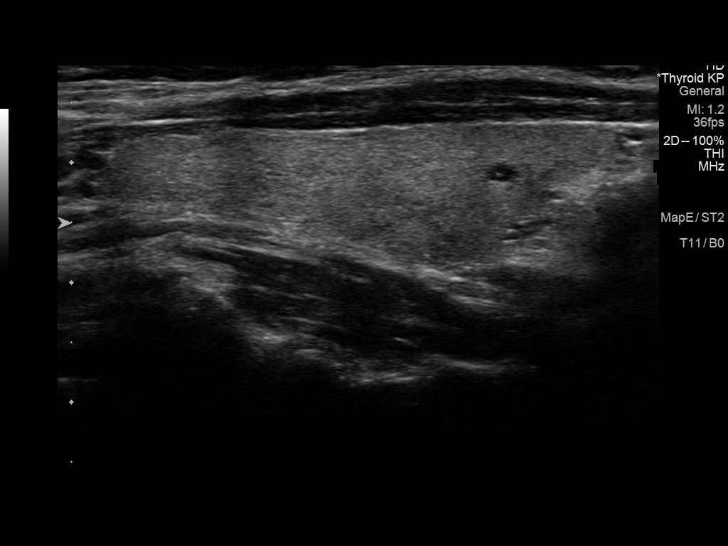
[im 51/68]
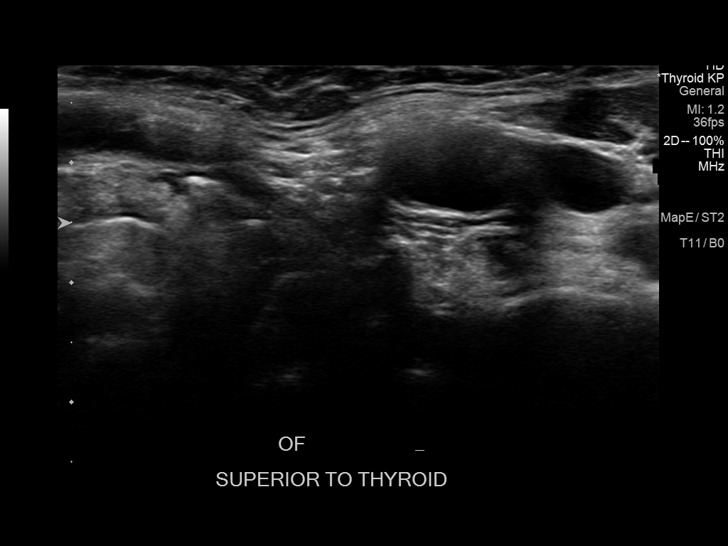
[im 56/68]
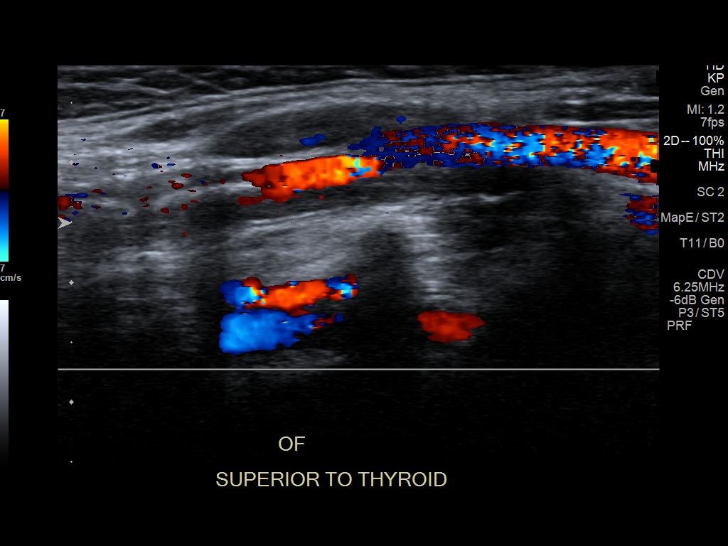
[im 62/68]
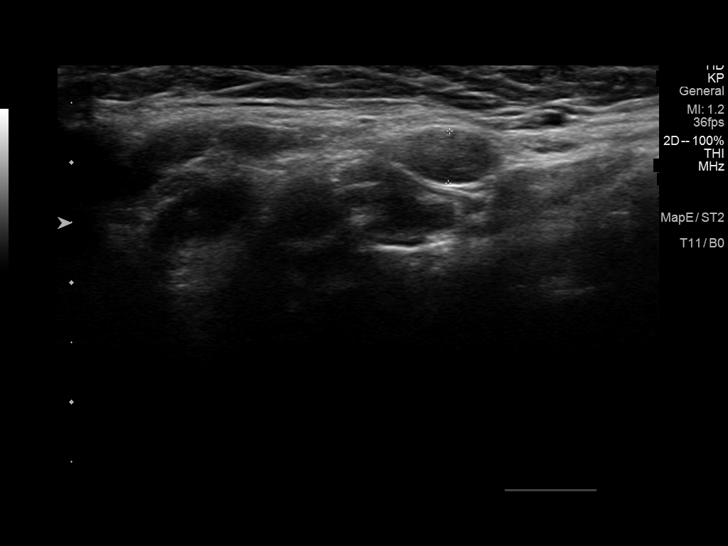
[im 68/68]
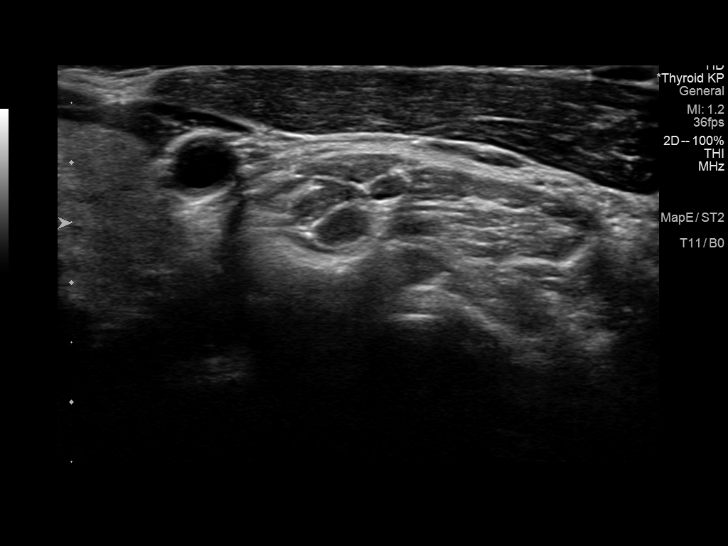

[13 of 25 positions shown; findings below may reference images not displayed]

FINDINGS: Parenchymal Echotexture: Mildly heterogenous

Isthmus: 0.2 cm thickness

Right lobe: 6.5 x 1.5 x 1.8 cm

Left lobe: 4.4 x 1.2 x 1.4 cm

_________________________________________________________

Estimated total number of nodules >/= 1 cm: 1

Number of spongiform nodules >/=  2 cm not described below (TR1): 0

Number of mixed cystic and solid nodules >/= 1.5 cm not described
below (TR2): 0

_________________________________________________________

Nodule # 1:

Location: Right; Superior

Maximum size: 0.9 cm; Other 2 dimensions: 0.7 x 0.6 cm

Composition: cannot determine (2)

Echogenicity: hypoechoic (2)

Shape: not taller-than-wide (0)

Margins: ill-defined (0)

Echogenic foci: punctate echogenic foci (3)

ACR TI-RADS total points: 7.

ACR TI-RADS risk category: TR5 (>/= 7 points).

ACR TI-RADS recommendations:

*Given size (>/= 0.5 - 0.9 cm) and appearance, a follow-up
ultrasound in 1 year should be considered based on TI-RADS criteria.

_________________________________________________________

Nodule # 2:

Location: Right; Mid

Maximum size: 1.5 cm; Other 2 dimensions:   1.2 x 0.9 cm

Composition: mixed cystic and solid (1)

Echogenicity: hypoechoic (2)

Shape: not taller-than-wide (0)

Margins: ill-defined (0)

Echogenic foci: none (0)

ACR TI-RADS total points: 3.

ACR TI-RADS risk category: TR3 (3 points).

ACR TI-RADS recommendations:

*Given size (>/= 1.5 - 2.4 cm) and appearance, a follow-up
ultrasound in 1 year should be considered based on TI-RADS criteria.

_________________________________________________________

Morphologically unremarkable cervical lymph nodes bilaterally, all
less than 0.5 mm are noted. There is a 0.4 cm node superior to the
thyroid corresponding to palpable area of concern.
IMPRESSION: 1. Mild thyromegaly with  nodules.  None meets criteria for biopsy.
2. Recommend annual/biennial ultrasound follow-up of right nodules
as above, until stability x5 years confirmed.
3. Morphologically unremarkable 0.4 cm lymph node in palpable region
of concern.

The above is in keeping with the ACR TI-RADS recommendations - [HOSPITAL] 2544;[DATE].

## 2022-09-06 ENCOUNTER — Ambulatory Visit: Payer: BC Managed Care – PPO | Admitting: Internal Medicine

## 2022-09-06 ENCOUNTER — Encounter: Payer: Self-pay | Admitting: Internal Medicine

## 2022-09-06 VITALS — BP 106/66 | HR 89 | Ht 69.0 in | Wt 138.6 lb

## 2022-09-06 DIAGNOSIS — E042 Nontoxic multinodular goiter: Secondary | ICD-10-CM | POA: Diagnosis not present

## 2022-09-06 DIAGNOSIS — E063 Autoimmune thyroiditis: Secondary | ICD-10-CM | POA: Diagnosis not present

## 2022-09-06 NOTE — Patient Instructions (Addendum)
You can stop Selenium 200 mcg daily.  Please stop at the lab.  Please come back for an appt in 1 year.

## 2022-09-06 NOTE — Progress Notes (Signed)
Patient ID: Madeline Walker, female   DOB: 1971-03-13, 52 y.o.   MRN: [id number]   HPI  Madeline Walker is a 52 y.o.-year-old female, initially referred by No ref. provider found, returning for follow-up for thyroid nodules and Hashimoto's thyroiditis. Last visit 6 months ago.  Interim history: Patient continues to experience left mid cervical pressure, similar to 3 years ago (see below).  She also feels that her voice is giving out intermittently, and she has hoarseness. Her integrative medicine provider checked her thyroid tests and antibodies in 11/2020 and started her on Cytomel 2 x5 mcg tablets daily as her ATA antibodies were positive.  We tapered this to off.  Her integrative medicine provider continues to insist that she starts treatment with thyroid hormones despite normal TFTs.  Reviewed and addended history: At our initial visit, patient patient described that she noticed swelling in her L neck radiating to the ear 1 month prior to the appointment and presented to see her PCP.  At that time, she was sent for a thyroid ultrasound.  This showed a slightly enlarged right thyroid lobe and 2 thyroid nodules. On the ultrasound, it appeared that the area of concern was a normal-appearing lymph node.  She started: Turmeric, Boswellia, Ibuprofen, Ashwagandha, Frankinsense and Lemongrass essential oils, black seed oil.  The inflammation on the left side of her neck decreased in size, but she was still feeling occasional pain in her ear.  She was referred to endocrinology for further evaluation of her thyroid nodules.  In 2022, she saw ENT who evaluated her vocal cords (she also has hoarseness) and this exam was normal.  Thyroid U/S (06/21/2020): Right thyroid lobe larger than the left, 2 thyroid nodules in the right lobe:  Parenchymal Echotexture: Mildly heterogenous Isthmus: 0.2 cm thickness Right lobe: 6.5 x 1.5 x 1.8 cm Left lobe: 4.4 x 1.2 x 1.4  cm _________________________________________________________   Nodule # 1: Location: Right; Superior Maximum size: 0.9 cm; Other 2 dimensions: 0.7 x 0.6 cm Composition: cannot determine (2) Echogenicity: hypoechoic (2) Shape: not taller-than-wide (0) Margins: ill-defined (0) Echogenic foci: punctate echogenic foci (3) ACR TI-RADS total points: 7. *Given size (>/= 0.5 - 0.9 cm) and appearance, a follow-up ultrasound in 1 year should be considered based on TI-RADS criteria.  _________________________________________________________   Nodule # 2: Location: Right; Mid Maximum size: 1.5 cm; Other 2 dimensions:   1.2 x 0.9 cm Composition: mixed cystic and solid (1) Echogenicity: hypoechoic (2) Shape: not taller-than-wide (0) Margins: ill-defined (0) Echogenic foci: none (0) ACR TI-RADS total points: 3. *Given size (>/= 1.5 - 2.4 cm) and appearance, a follow-up ultrasound in 1 year should be considered based on TI-RADS criteria.  _________________________________________________________   Morphologically unremarkable cervical lymph nodes bilaterally, all less than 0.5 mm are noted. There is a 0.4 cm node superior to the thyroid corresponding to palpable area of concern.   IMPRESSION: 1. Mild thyromegaly with  nodules.  None meets criteria for biopsy. 2. Recommend annual/biennial ultrasound follow-up of right nodules as above, until stability x5 years confirmed. 3. Morphologically unremarkable 0.4 cm lymph node in palpable region of concern.        Thyroid U/S (07/24/2021): Parenchymal Echotexture: Mildly heterogenous  Isthmus: 0.2 cm  Right lobe: 5.9 x 1.4 x 1.7 cm  Left lobe: 4.3 x 0.9 x 1.0 cm  _________________________________________________________   Nodule labeled 1 is a solid hypoechoic nodule with punctate echogenic foci (TR 5) in the superior right thyroid lobe that measures 0.9 x 0.8 x  0.6 cm, previously measuring 0.9 cm. It remains similar in size and  morphology.   Nodule labeled 3 (previously 2) there is a mixed cystic and solid nodule with hypoechoic solid areas that demonstrate punctate echogenic foci (TR 4) in the mid right thyroid lobe that measures 1.8 x 1.1 x 1.0 cm, slightly intervally increased in size partially due to increased cystic area. **Given size (>/= 1.5 cm) and appearance, fine needle aspiration of this moderately suspicious nodule should be considered based on TI-RADS criteria.   IMPRESSION: 1. Nodule labeled 3 (previously 2) in the mid right thyroid lobe demonstrates slight increase in size and more conspicuous suspicious features on today's exam (1.8 cm TR 4, previously 1.5 cm TR 3). This nodule meets criteria for biopsy. 2. Nodule labeled 1 demonstrates stable size and appearance (0.9 cm TR 5), and continues to meet criteria for follow-up ultrasound in 1 year. This exam marks 1 year stability.   FNA (08/16/2021): Clinical History: Nodule labeled 3, previously 2) there is a mixed  cystic and solid nodule with hypoechoic solid areas that demonstrate  punctate echogenic foci (TR 4) in the mid right thyroid lobe that  measures 1.8 x 1.1 x 1.0 cm, slightly intervally increased in size part  Specimen Submitted:  A. THYROID, RIGHT LOBE, FINE NEEDLE ASPIRATION:   FINAL MICROSCOPIC DIAGNOSIS:  - Consistent with benign follicular nodule (Bethesda category II)   SPECIMEN ADEQUACY:  Satisfactory for evaluation   FNA (07/13/2022): stable nodules: Parenchymal Echotexture: Mildly heterogenous  Isthmus: 0.2 cm  Right lobe: 5.9 x 1.5 x 1.8 cm  Left lobe: 4.2 x 0.9 x 1.0 cm  _________________________________________________________   Estimated total number of nodules >/= 1 cm: 1 _________________________________________________________   Nodule # 1: Hypoechoic solid nodule with punctate echogenic foci in the right upper gland remains unchanged at 0.9 x 0.8 x 0.6 cm. This exam confirms 2 years of stability.    Nodule # 3: Previously biopsied nodule in the right mid to lower gland is stable at 1.8 x 1.3 x 1.1 cm. No new nodules or suspicious features.   IMPRESSION: 1. Confirmed 2 year stability of 0.9 cm TI-RADS category 5 nodule in the right upper gland. Recommend continued annual imaging surveillance until 5 year stability has been confirmed. 2. Unchanged appearance of previously biopsied nodule in the right mid to lower gland. 3. No new nodules or suspicious features.  Pt denies: - feeling nodules in neck - hoarseness - dysphagia - choking  She is now off Cytomel, stopped 07/2021. We started selenium 200 mcg daily in 2023, she continues to take it now.  She did not feel a difference after starting this.  Reviewed her TFTs: 11/29/2021: TSH 1.84, free T3 2.6 (2-4.4), free T4 0.99 (0.82-1.77), TPO antibodies 19 (0-34), ATA antibodies <1.0.   Lab Results  Component Value Date   TSH 1.16 08/15/2021   TSH 0.69 07/05/2020   FREET4 0.86 08/15/2021   FREET4 1.03 07/05/2020  04/25/2021: TSH 0.798, fT4 0.95, fT3 3.1, ATA Abs <1, TPO Abs 11 - on LT3 10 mcg daily 12/22/2020: TSH 1.18 (0.45-4.5), fT4 1.24 (0.82-1.77), fT3 2.2 (2.0-4.4), ATA Abs 5.1 (<0.9), TPO Abs 10 (<34), TRAb 1.66 (<1.75) - started LT3 10 mcg daily  Component     Latest Ref Rng & Units 07/05/2020  Thyroglobulin Ab     < or = 1 IU/mL <1  Thyroperoxidase Ab SerPl-aCnc     <9 IU/mL 2   06/17/2020: TSH 0.67 (0.34-4.5)  No FH of thyroid  ds. No FH of thyroid cancer. No h/o radiation tx to head or neck. No steroid use.  No Biotin supplements or Hair, Skin and Nails vitamins. On Collage, Silica, vit C and D + K2, Mg, probiotics, Ashwagadha, rhodiola.  Of note, she went through menopause early, at 52 y/o (POF), and she was started on estradiol transdermal patch, bioidentical progesterone and testosterone compounded cream - she gets these from [employer].  She is also on a supplement to control estrogen,  which contains 600 mcg iodine (400% of the rec'd TDD)  She has a history of a miscarriage in 2010, anxiety, BPPV, h/o volvulus sx 2019, followed by abdominal hernia resection with mesh placement, with postsx hiatal hernia and GERD.  ROS: + see HPI  Past Medical History:  Diagnosis Date   BPPV (benign paroxysmal positional vertigo)    [facility]   Breast cyst    Chest pain    Fibrocystic breast    History of chicken pox    Infertility, female    Melanoma ([place])    "right upper arm/shoulder"   Migraine    "under control w/daily RX; still get one ~ q month" (01/13/2018)   Ovarian cyst    Palpitations    Premature ovarian failure    Yeast infection    Past Surgical History:  Procedure Laterality Date   ABDOMINOPLASTY  2015   CESAREAN SECTION  2002   CESAREAN SECTION  10/31/2011   Procedure: CESAREAN SECTION;  Surgeon: Eldred Manges, MD;  Location: [facility];  Service: Gynecology;  Laterality: N/A;   COLON SURGERY  01/13/2018   EXPLORATORY LAPAROTOMY AND EXTENDED RIGHT HEMICOLECTOMY   DILATION AND CURETTAGE OF UTERUS  2010   SAB   HERNIA REPAIR     INCISIONAL HERNIA REPAIR N/A 06/13/2018   Procedure: VENTRAL INCISIONAL HERNIA REPAIR WITH  MESH ERAS PATHWAY;  Surgeon: Donnie Mesa, MD;  Location: [place] [facility];  Service: General;  Laterality: N/A;   LAPAROTOMY N/A 01/13/2018   Procedure: EXPLORATORY LAPAROTOMY AND EXTENDED RIGHT HEMICOLECTOMY;  Surgeon: Donnie Mesa, MD;  Location: [facility];  Service: General;  Laterality: N/A;   MELANOMA EXCISION Right    "arm; near shoulder"   RHINOPLASTY  [id number]   UMBILICAL HERNIA REPAIR     Social History   Socioeconomic History   Marital status: Married    Spouse name: Not on file   Number of children: 2   Years of education: 16   Highest education level: Master's degree (e.g., MA, MS, MEng, MEd, MSW, MBA)  Occupational History   Not on file  Tobacco Use   Smoking status: Never   Smokeless tobacco: Never  Vaping Use    Vaping Use: Never used  Substance and Sexual Activity   Alcohol use: Yes    Alcohol/week: 1.0 standard drink of alcohol    Types: 1 Standard drinks or equivalent per week   Drug use: Not Currently   Sexual activity: Yes    Comment: menopause  Other Topics Concern   Not on file  Social History Narrative   Lives at home with husband and children   Right handed   1/2 cup coffee in the morning   Social Determinants of Health   Financial Resource Strain: Not on file  Food Insecurity: Not on file  Transportation Needs: Not on file  Physical Activity: Not on file  Stress: Not on file  Social Connections: Not on file  Intimate Partner Violence: Not on file  Current Outpatient Medications on File Prior to Visit  Medication Sig Dispense Refill   estradiol (VIVELLE-DOT) 0.05 MG/24HR patch Place 1 patch onto the skin every 3 (three) days.  1   nortriptyline (PAMELOR) 10 MG capsule TAKE 2 CAPSULES (20 MG TOTAL) AT BEDTIME. MUST BE SEEN FOR FURTHER REFILLS, CALL [phone]. 60 capsule 0   progesterone (PROMETRIUM) 100 MG capsule Take 200 mg by mouth at bedtime.  2   rizatriptan (MAXALT) 10 MG tablet TAKE 1 TABLET BY MOUTH AS NEEDED FOR MIGRAINE. MAY REPEAT IN 2 HOURS IF NEEDED. MAX 2 TABLETS IN 24 HOURS. 10 tablet 11   Current Facility-Administered Medications on File Prior to Visit  Medication Dose Route Frequency Provider Last Rate Last Admin   gadopentetate dimeglumine (MAGNEVIST) injection 13 mL  13 mL Intravenous Once PRN Melvenia Beam, MD       No Known Allergies Family History  Problem Relation Age of Onset   COPD Mother    Mental illness Mother        DEPRESSION AND SUICIDE   Migraines Mother    Hypertension Father    Cancer Father        KIDNEY AND PROSTATE   Anemia Sister    Autism Son    Cancer Maternal Uncle        LUNG   Rheum arthritis Maternal Grandmother    Diabetes Maternal Grandfather        TYPE I   Cancer Paternal Grandfather        colon    PE: BP 106/66 (BP Location: Left Arm, Patient Position: Sitting, Cuff Size: Normal)   Pulse 89   Ht '5\' 9"'$  (1.753 m)   Wt 138 lb 9.6 oz (62.9 kg)   LMP 01/29/2011   SpO2 97%   BMI 20.47 kg/m  Wt Readings from Last 3 Encounters:  09/06/22 138 lb 9.6 oz (62.9 kg)  01/03/22 137 lb 3.2 oz (62.2 kg)  07/05/21 134 lb 3.2 oz (60.9 kg)   Constitutional: normal weight, in NAD Eyes: PERRLA, EOMI, no exophthalmos ENT: no thyromegaly, but thyroid visible with deglutition, no thyroid nodules palpated, no cervical lymphadenopathy but fullness palpated on the right side of the neck Cardiovascular: RRR, No MRG Respiratory: CTA B Musculoskeletal: no deformities Skin no rashes Neurological: no tremor with outstretched hands  ASSESSMENT: 1.  Multiple thyroid nodules  2.  Enlarged lymph node  3.  Hashimoto's thyroiditis  PLAN: 1.  Multiple thyroid nodules - Patient with history of left-sided anterior neck swelling, found to be related to a normal-appearing lymph node on her ultrasound from 06/2020.  However, during investigation for this, she was found to have an enlarged right thyroid lobe and 2 right thyroid nodules.  Per review of the ultrasound images, the nodules were not large, they were mostly hypoechoic, without internal blood flow, more wide than tall, and without invasion of surrounding tissues.  They did contain hyperechoic foci, difficult to determine whether these were microcalcifications or colloid crystals.  Some of the particles did have a slight comet tail, pointing towards benignity.  The largest nodule contains a significant amount of fluid. - we repeated her thyroid ultrasound (07/24/2021): The mid right thyroid nodule was mixed, cystic/solid and measured 1.8 x 1.1 x 1.0 cm.  We biopsied this nodule on 08/16/2021 and the results were benign.  A follow-up ultrasound in a year was recommended for the other nodule.  She had this ultrasound on 07/14/2022.  This shows stability of her  nodules,  which do not require intervention, just follow-up in another year. -Of note, she has no family history of thyroid cancer or personal history of radiation therapy to the head or neck to increase her risk of thyroid cancer -At today's visit, we discussed that we do not absolutely need to repeat the ultrasound next year, we can wait another year for that.  She agrees with the plan. I will see her back in 1 year-  2.  Enlarged lymph node -A normal-sized lymph node was seen on her ultrasound from 06/2020 corresponding to the area of pressure in the left upper area of her neck.  We discussed about possible causes for enlarged lymph nodes at last visit, including inflammation and infection. -There was no mention of an enlarged lymph node on her thyroid ultrasound from 07/21/2021 or 07/21/2022 -No neck compression symptoms  3.  Hashimoto's thyroiditis -Her antithyroid antibodies were normal here in the office, but these were rechecked by her integrative medicine provider and the ATA was found to be elevated.  Her TSH at that time was excellent, at 1.18 and the free T4 and free T3 were normal.  It was unclear why she was started on Cytomel 10 mg daily but after discussion about the negative effects, we were able to stop the medication.  She did not feel differently after stopping it. -Before last visit, she again had labs with her integrative medicine provider and was told that her thyroid was low again and he was surprised that she is not tired or has weight gain of thyroid hormones.  Upon review of the labs brought by patient, 1 month ago, TSH was 1.84, excellent, while free T4, free T3, TPO and ATA antibodies were all normal.   -We did not recheck the test at last visit, but I did advise her to start selenium 200 mcg daily to see if it helps with the left cervical neck pressure.  She did start but she is not feeling differently.  I advised her to stop. -At today's visit, she is on ashwagandha and  Rhodiola, which can influence the thyroid tests.  We decided to check her TFTs now and if they are abnormal, to stop the supplements and repeat them.  If the TFTs are normal, she can continue the supplements.  She agrees with the plan.  Component     Latest Ref Rng 09/06/2022  TSH     0.35 - 5.50 uIU/mL 0.80   T4,Free(Direct)     0.60 - 1.60 ng/dL 0.60   Triiodothyronine,Free,Serum     2.3 - 4.2 pg/mL 2.8   TFTs are normal.  Philemon Kingdom, MD PhD [facility]

## 2022-09-07 LAB — T3, FREE: T3, Free: 2.8 pg/mL (ref 2.3–4.2)

## 2022-09-07 LAB — TSH: TSH: 0.8 u[IU]/mL (ref 0.35–5.50)

## 2022-09-07 LAB — T4, FREE: Free T4: 0.6 ng/dL (ref 0.60–1.60)

## 2023-08-26 ENCOUNTER — Other Ambulatory Visit: Payer: Self-pay | Admitting: Obstetrics and Gynecology

## 2023-08-26 DIAGNOSIS — Z1231 Encounter for screening mammogram for malignant neoplasm of breast: Secondary | ICD-10-CM

## 2023-08-30 ENCOUNTER — Encounter: Payer: Self-pay | Admitting: Internal Medicine

## 2023-08-30 ENCOUNTER — Other Ambulatory Visit: Payer: Self-pay | Admitting: Internal Medicine

## 2023-08-30 DIAGNOSIS — E042 Nontoxic multinodular goiter: Secondary | ICD-10-CM

## 2023-09-03 ENCOUNTER — Ambulatory Visit
Admission: RE | Admit: 2023-09-03 | Discharge: 2023-09-03 | Disposition: A | Discharge status: Home / Self Care | Payer: BC Managed Care – PPO | Source: Ambulatory Visit | Attending: Internal Medicine | Admitting: Internal Medicine

## 2023-09-03 DIAGNOSIS — E042 Nontoxic multinodular goiter: Secondary | ICD-10-CM

## 2023-09-06 ENCOUNTER — Encounter: Payer: Self-pay | Admitting: Internal Medicine

## 2023-09-06 ENCOUNTER — Ambulatory Visit: Payer: BC Managed Care – PPO | Admitting: Internal Medicine

## 2023-09-06 VITALS — BP 118/62 | HR 97 | Ht 69.0 in | Wt 140.2 lb

## 2023-09-06 DIAGNOSIS — E063 Autoimmune thyroiditis: Secondary | ICD-10-CM

## 2023-09-06 DIAGNOSIS — E042 Nontoxic multinodular goiter: Secondary | ICD-10-CM

## 2023-09-06 NOTE — Patient Instructions (Addendum)
 Please come back for an appt in 1 year.

## 2023-09-06 NOTE — Progress Notes (Signed)
 Patient ID: Madeline Walker, female   DOB: 1970/09/30, 53 y.o.   MRN: [id number]   HPI  Madeline Walker is a 53 y.o.-year-old female, initially referred by No ref. provider found, returning for follow-up for thyroid nodules and Hashimoto's thyroiditis. Last visit 6 months ago.  Interim history: At today's visit, she feels well, without complaints. No neck compression symptoms at today's visit.  Reviewed and addended history: At our initial visit, patient patient described that she noticed swelling in her L neck radiating to the ear 1 month prior to the appointment and presented to see her PCP.  At that time, she was sent for a thyroid ultrasound.  This showed a slightly enlarged right thyroid lobe and 2 thyroid nodules. On the ultrasound, it appeared that the area of concern was a normal-appearing lymph node.  She started: Turmeric, Boswellia, Ibuprofen, Ashwagandha, Frankinsense and Lemongrass essential oils, black seed oil.  The inflammation on the left side of her neck decreased in size, but she was still feeling occasional pain in her ear.  She was referred to endocrinology for further evaluation of her thyroid nodules.  In 2022, she saw ENT who evaluated her vocal cords (she also has hoarseness) and this exam was normal.  Thyroid U/S (06/21/2020): Right thyroid lobe larger than the left, 2 thyroid nodules in the right lobe:  Parenchymal Echotexture: Mildly heterogenous Isthmus: 0.2 cm thickness Right lobe: 6.5 x 1.5 x 1.8 cm Left lobe: 4.4 x 1.2 x 1.4 cm _________________________________________________________   Nodule # 1: Location: Right; Superior Maximum size: 0.9 cm; Other 2 dimensions: 0.7 x 0.6 cm Composition: cannot determine (2) Echogenicity: hypoechoic (2) Shape: not taller-than-wide (0) Margins: ill-defined (0) Echogenic foci: punctate echogenic foci (3) ACR TI-RADS total points: 7. *Given size (>/= 0.5 - 0.9 cm) and appearance, a follow-up ultrasound in 1 year  should be considered based on TI-RADS criteria.  _________________________________________________________   Nodule # 2: Location: Right; Mid Maximum size: 1.5 cm; Other 2 dimensions:   1.2 x 0.9 cm Composition: mixed cystic and solid (1) Echogenicity: hypoechoic (2) Shape: not taller-than-wide (0) Margins: ill-defined (0) Echogenic foci: none (0) ACR TI-RADS total points: 3. *Given size (>/= 1.5 - 2.4 cm) and appearance, a follow-up ultrasound in 1 year should be considered based on TI-RADS criteria.  _________________________________________________________   Morphologically unremarkable cervical lymph nodes bilaterally, all less than 0.5 mm are noted. There is a 0.4 cm node superior to the thyroid corresponding to palpable area of concern.   IMPRESSION: 1. Mild thyromegaly with  nodules.  None meets criteria for biopsy. 2. Recommend annual/biennial ultrasound follow-up of right nodules as above, until stability x5 years confirmed. 3. Morphologically unremarkable 0.4 cm lymph node in palpable region of concern.        Thyroid U/S (07/24/2021): Parenchymal Echotexture: Mildly heterogenous  Isthmus: 0.2 cm  Right lobe: 5.9 x 1.4 x 1.7 cm  Left lobe: 4.3 x 0.9 x 1.0 cm  _________________________________________________________   Nodule labeled 1 is a solid hypoechoic nodule with punctate echogenic foci (TR 5) in the superior right thyroid lobe that measures 0.9 x 0.8 x 0.6 cm, previously measuring 0.9 cm. It remains similar in size and morphology.   Nodule labeled 3 (previously 2) there is a mixed cystic and solid nodule with hypoechoic solid areas that demonstrate punctate echogenic foci (TR 4) in the mid right thyroid lobe that measures 1.8 x 1.1 x 1.0 cm, slightly intervally increased in size partially due to increased cystic area. **Given  size (>/= 1.5 cm) and appearance, fine needle aspiration of this moderately suspicious nodule should be considered based on  TI-RADS criteria.   IMPRESSION: 1. Nodule labeled 3 (previously 2) in the mid right thyroid lobe demonstrates slight increase in size and more conspicuous suspicious features on today's exam (1.8 cm TR 4, previously 1.5 cm TR 3). This nodule meets criteria for biopsy. 2. Nodule labeled 1 demonstrates stable size and appearance (0.9 cm TR 5), and continues to meet criteria for follow-up ultrasound in 1 year. This exam marks 1 year stability.   FNA (08/16/2021): Clinical History: Nodule labeled 3, previously 2) there is a mixed  cystic and solid nodule with hypoechoic solid areas that demonstrate  punctate echogenic foci (TR 4) in the mid right thyroid lobe that  measures 1.8 x 1.1 x 1.0 cm, slightly intervally increased in size part  Specimen Submitted:  A. THYROID, RIGHT LOBE, FINE NEEDLE ASPIRATION:   FINAL MICROSCOPIC DIAGNOSIS:  - Consistent with benign follicular nodule (Bethesda category II)   SPECIMEN ADEQUACY:  Satisfactory for evaluation   FNA (07/13/2022): stable nodules: Parenchymal Echotexture: Mildly heterogenous  Isthmus: 0.2 cm  Right lobe: 5.9 x 1.5 x 1.8 cm  Left lobe: 4.2 x 0.9 x 1.0 cm  _________________________________________________________   Estimated total number of nodules >/= 1 cm: 1 _________________________________________________________   Nodule # 1: Hypoechoic solid nodule with punctate echogenic foci in the right upper gland remains unchanged at 0.9 x 0.8 x 0.6 cm. This exam confirms 2 years of stability.   Nodule # 3: Previously biopsied nodule in the right mid to lower gland is stable at 1.8 x 1.3 x 1.1 cm. No new nodules or suspicious features.   IMPRESSION: 1. Confirmed 2 year stability of 0.9 cm TI-RADS category 5 nodule in the right upper gland. Recommend continued annual imaging surveillance until 5 year stability has been confirmed. 2. Unchanged appearance of previously biopsied nodule in the right mid to lower gland. 3. No new  nodules or suspicious features.  Thyroid U/S (09/03/2023): Parenchymal Echotexture: Normal  Isthmus: 0.3 cm  Right lobe: 5.6 x 1.4 x 1.5 cm  Left lobe: 4.2 x 1.2 x 1.1 cm  _________________________________________________________   Estimated total number of nodules >/= 1 cm: 1 _________________________________________________________   Nodule # 1: Circumscribed hypoechoic nodule in the right upper gland measures 0.9 x 0.8 x 0.6 cm. Small punctate echogenic foci are again noted. Findings remain consistent with TI-RADS category 5. *Given size (>/= 0.5 - 0.9 cm) and appearance, a follow-up ultrasound in 1 year should be considered based on TI-RADS criteria.   Nodule # 2: Small hypoechoic solid nodule in the right mid gland measures 0.8 x 0.8 x 0.4 cm. Findings are consistent with TI-RADS category 4. Given size (<0.9 cm) and appearance, this nodule does NOT meet TI-RADS criteria for biopsy or dedicated follow-up.   Nodule # 3: Previously biopsied nodule in the right mid to lower gland in size and now measures 1.6 x 1.0 x 0.9 cm compared to 1.8 x 1.3 x 1.1 cm previously.   IMPRESSION: 1. Continued stability of small 0.9 cm TI-RADS category 5 nodule in the right upper gland. This exam confirms 3 years of stability. Recommend continued annual surveillance imaging until 5 years of stability is confirmed. 2. Decreasing size of previously biopsied nodule in the right mid to lower gland. Involution over time is consistent with benignity.  Pt denies: - feeling nodules in neck - hoarseness - dysphagia - choking At last  few visits, she had left mid cervical pressure.  She also felt that her voice is giving out intermittently.  However, no complaints now.  Her integrative medicine provider checked her thyroid tests and antibodies in 11/2020 and started her on Cytomel 2 x5 mcg tablets daily as her ATA antibodies were positive.  We tapered this to off - stopped 07/2021.  Her integrative  medicine provider continues to insist that she starts treatment with thyroid hormones despite normal TFTs.  We started selenium 200 mcg daily in 2023. She did not feel a difference after starting this. Now takes it approximately once a week.  Reviewed her TFTs: 05/2023: TSH normal reportedly, as checked at [facility] Lab Results  Component Value Date   TSH 0.80 09/06/2022   TSH 1.16 08/15/2021   TSH 0.69 07/05/2020   FREET4 0.60 09/06/2022   FREET4 0.86 08/15/2021   FREET4 1.03 07/05/2020  11/29/2021: TSH 1.84, free T3 2.6 (2-4.4), free T4 0.99 (0.82-1.77), TPO antibodies 19 (0-34), ATA antibodies <1.0.   04/25/2021: TSH 0.798, fT4 0.95, fT3 3.1, ATA Abs <1, TPO Abs 11 - on LT3 10 mcg daily 12/22/2020: TSH 1.18 (0.45-4.5), fT4 1.24 (0.82-1.77), fT3 2.2 (2.0-4.4), ATA Abs 5.1 (<0.9), TPO Abs 10 (<34), TRAb 1.66 (<1.75) - started LT3 10 mcg daily 06/17/2020: TSH 0.67 (0.34-4.5)  Component     Latest Ref Rng & Units 07/05/2020  Thyroglobulin Ab     < or = 1 IU/mL <1  Thyroperoxidase Ab SerPl-aCnc     <9 IU/mL 2   No FH of thyroid ds. No FH of thyroid cancer. No h/o radiation tx to head or neck. No steroid use.  No Biotin supplements. On Hair, Skin and Nails vitamins. On Collage, Silica, vit C and D + K2, Mg, probiotics. She is on Ashwagadha, rhodiola - intermittently.  Of note, she went through menopause early, at 53 y/o (POF), and she was started on estradiol transdermal patch, bioidentical progesterone and testosterone compounded cream - she gets these from [employer].  She is also on estrogen, which contains 600 mcg iodine (400% of the rec'd TDD)  She has a history of a miscarriage in 2010, anxiety, BPPV, h/o volvulus sx 2019, followed by abdominal hernia resection with mesh placement, with postsx hiatal hernia and GERD.  ROS: + see HPI  Past Medical History:  Diagnosis Date   BPPV (benign paroxysmal positional vertigo)    [facility]  ENT   Breast cyst    Chest pain    Fibrocystic breast    History of chicken pox    Infertility, female    Melanoma (HCC)    "right upper arm/shoulder"   Migraine    "under control w/daily RX; still get one ~ q month" (01/13/2018)   Ovarian cyst    Palpitations    Premature ovarian failure    Yeast infection    Past Surgical History:  Procedure Laterality Date   ABDOMINOPLASTY  2015   CESAREAN SECTION  2002   CESAREAN SECTION  10/31/2011   Procedure: CESAREAN SECTION;  Surgeon: Hal Morales, MD;  Location: [facility];  Service: Gynecology;  Laterality: N/A;   COLON SURGERY  01/13/2018   EXPLORATORY LAPAROTOMY AND EXTENDED RIGHT HEMICOLECTOMY   DILATION AND CURETTAGE OF UTERUS  2010   SAB   HERNIA REPAIR     INCISIONAL HERNIA REPAIR N/A 06/13/2018   Procedure: VENTRAL INCISIONAL HERNIA REPAIR WITH  MESH ERAS PATHWAY;  Surgeon: Manus Rudd, MD;  Location: [facility];  Service: General;  Laterality: N/A;   LAPAROTOMY N/A 01/13/2018   Procedure: EXPLORATORY LAPAROTOMY AND EXTENDED RIGHT HEMICOLECTOMY;  Surgeon: Manus Rudd, MD;  Location: [facility];  Service: General;  Laterality: N/A;   MELANOMA EXCISION Right    "arm; near shoulder"   RHINOPLASTY  1994   UMBILICAL HERNIA REPAIR     Social History   Socioeconomic History   Marital status: Married    Spouse name: Not on file   Number of children: 2   Years of education: 16   Highest education level: Master's degree (e.g., MA, MS, MEng, MEd, MSW, MBA)  Occupational History   Not on file  Tobacco Use   Smoking status: Never   Smokeless tobacco: Never  Vaping Use   Vaping status: Never Used  Substance and Sexual Activity   Alcohol use: Yes    Alcohol/week: 1.0 standard drink of alcohol    Types: 1 Standard drinks or equivalent per week   Drug use: Not Currently   Sexual activity: Yes    Comment: menopause  Other Topics Concern   Not on file  Social History Narrative   Lives at home with husband and  children   Right handed   1/2 cup coffee in the morning   Social Drivers of Health   Financial Resource Strain: Not on file  Food Insecurity: Not on file  Transportation Needs: Not on file  Physical Activity: Not on file  Stress: Not on file  Social Connections: Not on file  Intimate Partner Violence: Not on file   Current Outpatient Medications on File Prior to Visit  Medication Sig Dispense Refill   estradiol (VIVELLE-DOT) 0.05 MG/24HR patch Place 1 patch onto the skin every 3 (three) days.  1   nortriptyline (PAMELOR) 10 MG capsule TAKE 2 CAPSULES (20 MG TOTAL) AT BEDTIME. MUST BE SEEN FOR FURTHER REFILLS, CALL [phone]. 60 capsule 0   progesterone (PROMETRIUM) 100 MG capsule Take 200 mg by mouth at bedtime.  2   rizatriptan (MAXALT) 10 MG tablet TAKE 1 TABLET BY MOUTH AS NEEDED FOR MIGRAINE. MAY REPEAT IN 2 HOURS IF NEEDED. MAX 2 TABLETS IN 24 HOURS. 10 tablet 11   Current Facility-Administered Medications on File Prior to Visit  Medication Dose Route Frequency Provider Last Rate Last Admin   gadopentetate dimeglumine (MAGNEVIST) injection 13 mL  13 mL Intravenous Once PRN Anson Fret, MD       No Known Allergies Family History  Problem Relation Age of Onset   COPD Mother    Mental illness Mother        DEPRESSION AND SUICIDE   Migraines Mother    Hypertension Father    Cancer Father        KIDNEY AND PROSTATE   Anemia Sister    Autism Son    Cancer Maternal Uncle        LUNG   Rheum arthritis Maternal Grandmother    Diabetes Maternal Grandfather        TYPE I   Cancer Paternal Grandfather        colon   PE: BP 118/62   Pulse 97   Ht 5\' 9"  (1.753 m)   Wt 140 lb 3.2 oz (63.6 kg)   LMP 01/29/2011   SpO2 98%   BMI 20.70 kg/m  Wt Readings from Last 3 Encounters:  09/06/23 140 lb 3.2 oz (63.6 kg)  09/06/22 138 lb 9.6 oz (62.9 kg)  01/03/22 137 lb 3.2 oz (  62.2 kg)   Constitutional: normal weight, in NAD Eyes:  EOMI, no exophthalmos ENT: no  thyromegaly, no thyroid nodules palpated, no cervical lymphadenopathy  Cardiovascular: RRR, No MRG Respiratory: CTA B Musculoskeletal: no deformities Skin no rashes Neurological: no tremor with outstretched hands  ASSESSMENT: 1.  Multiple thyroid nodules  2.  Enlarged lymph node  3.  Hashimoto's thyroiditis  PLAN: 1.  Multiple thyroid nodules -No neck compression symptoms -Patient with history of left-sided anterior neck swelling, found to be related to a normal-appearing lymph node on her ultrasound from 06/2020.  However, during investigation for this, she was found to have an enlarged right thyroid lobe and 2 right thyroid nodules.  The nodules were not large, they were mostly hypoechoic, without internal blood flow, more wide than tall and without irregular margins.  They did contain hyperechoic foci, difficult to determine whether these were microcalcifications or cholate crystals.  Some of the particles had a slight comet tail, pointing towards benignity. The largest nodule contains a significant amount of fluid. -We repeated the thyroid ultrasound in 07/2021: The mid right thyroid nodule was mixed, cystic/solid, and measured 1.8 x 1.1 x 1.0 cm.  We biopsied this nodule in 08/2021 with benign results.  Follow-up ultrasound was recommended in a year.  She had this in 07/2022.  The right upper lobe nodule still required yearly follow-up for another 2 years.  At last visit, we ordered another thyroid ultrasound, which she just had on 09/03/2023.  The right upper lobe nodule was still a TI-RADS category 5, still requiring annual follow-up.  There was a decrease in size of the right mid lobe thyroid nodule. -Plan to repeat another ultrasound in a year -I advised her to get in touch with me to order this before the visit, just as we did before this visit -She had a TSH checked by her integrative medicine provider in 05/2023.  This was reportedly normal.  We will not recheck it today. -She is on  biotin in Hair skin and nails and we discussed about staying off the supplement before thyroid or any other hormonal labs.  I advised her to pay attention to the dose of biotin supplement. -I will see her back in a year  2.  Enlarged lymph node -A normal-sized lymph node was seen on her ultrasound from 06/2020 corresponding to the area of pressure in the left upper area of her neck.  We discussed about possible causes for enlarged lymph nodes at last visit, including inflammation and infection. -There was no mention on an enlarged lymph node on the ultrasound from 07/2021, 07/2022, or the latest ultrasound from 08/2023  -No neck compression symptoms -No further follow-up is needed for this  3.  Hashimoto's thyroiditis -Patient with previously normal antithyroid antibodies obtained here in office but elevated ATA antibodies on recheck by integrative medicine provider. -Her TFTs were normal in the past but the integrative medicine provider suggested Cytomel 10 mg daily.  After discussion about the negative effects of taking this with a normal thyroid function, we were able to stop the medication with persistently normal TFTs. -She was on selenium 200 mcg daily.  She mentioned that she did not feel differently after starting selenium so we discussed about stopping it at last visit.  However, she is still taking it but only approximately once a week. -At last visit she was on ashwagandha and Rhodiola, and we discussed that this could influence the thyroid tests.  However, the TFTs were normal so I  did not advise her to stop them.  She continues to take them intermittently.    Carlus Pavlov, MD PhD [facility]

## 2023-09-15 IMAGING — US US THYROID
1 series · 13 of 25 positions shown · non-contrast
Comparison: None.

CLINICAL DATA: Prior ultrasound follow-up.

EXAM:
THYROID ULTRASOUND
TECHNIQUE: Ultrasound examination of the thyroid gland and adjacent soft
tissues was performed.

[Series 1: us thyroid · 0.03mm/px · 13 of 69 slices shown]
[im 1/69]
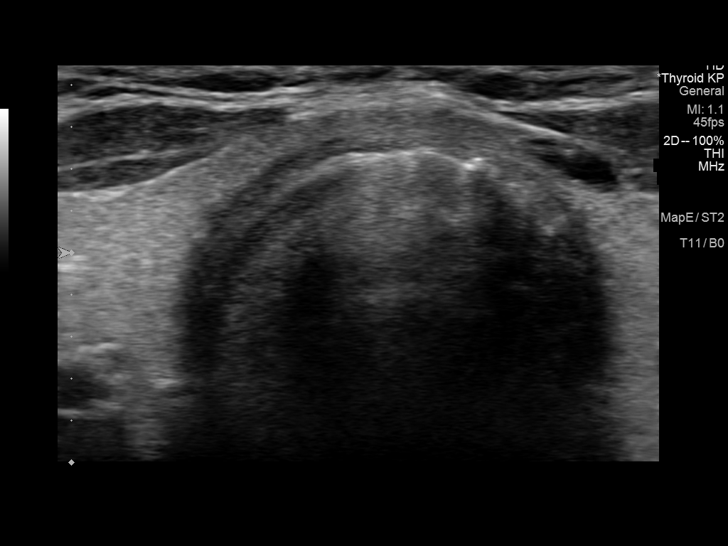
[im 6/69]
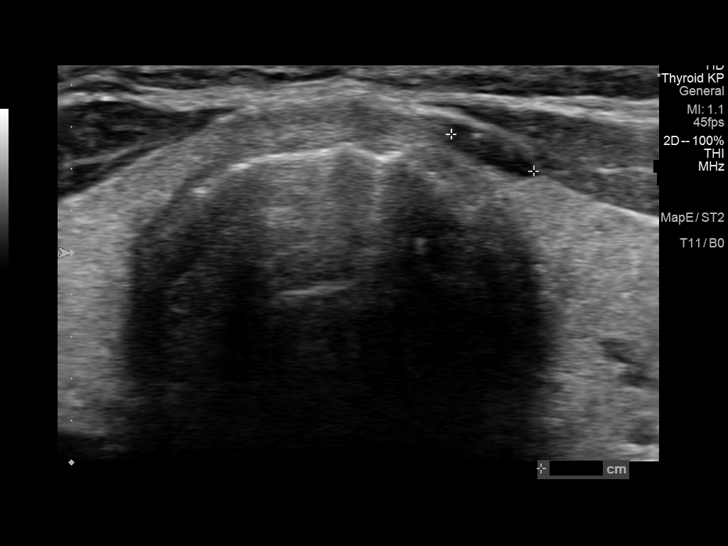
[im 12/69]
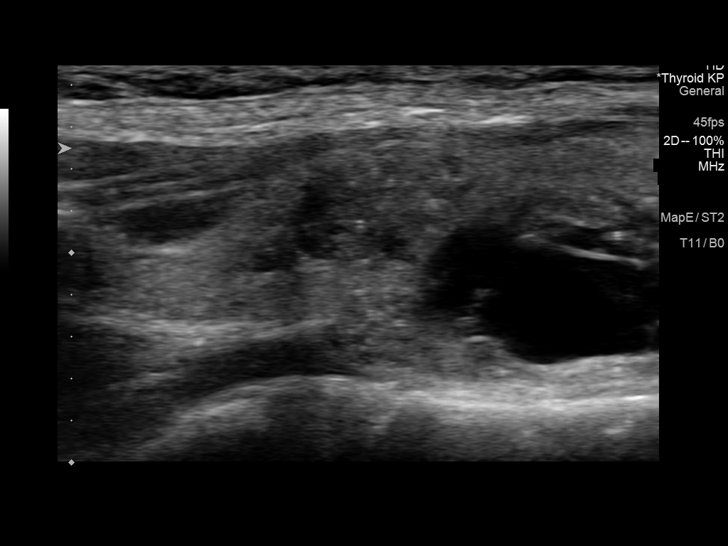
[im 18/69]
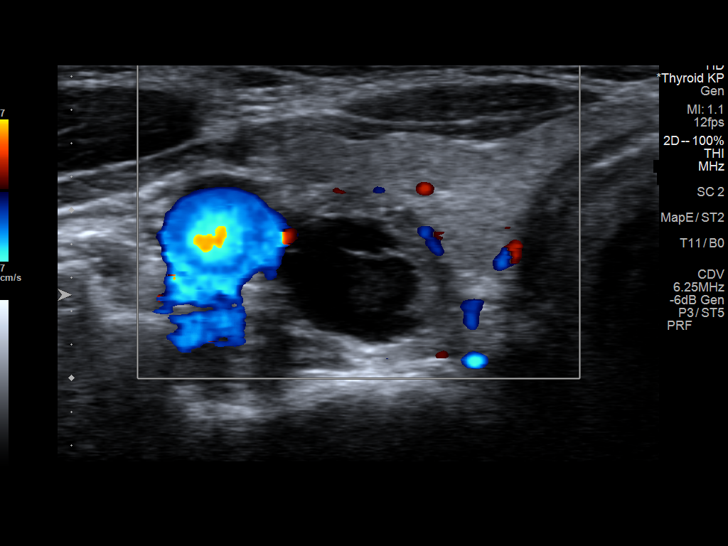
[im 23/69]
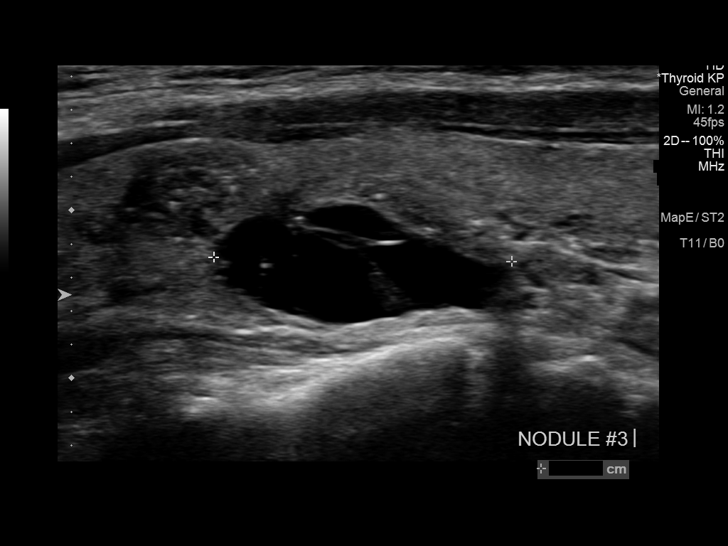
[im 29/69]
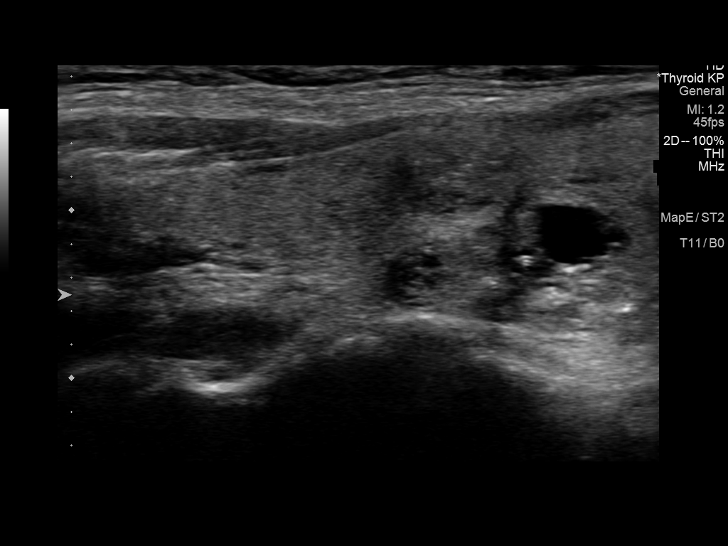
[im 35/69]
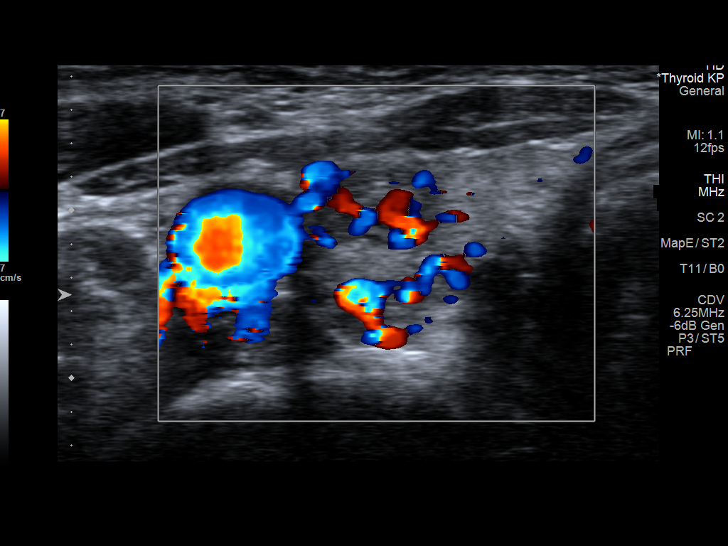
[im 40/69]
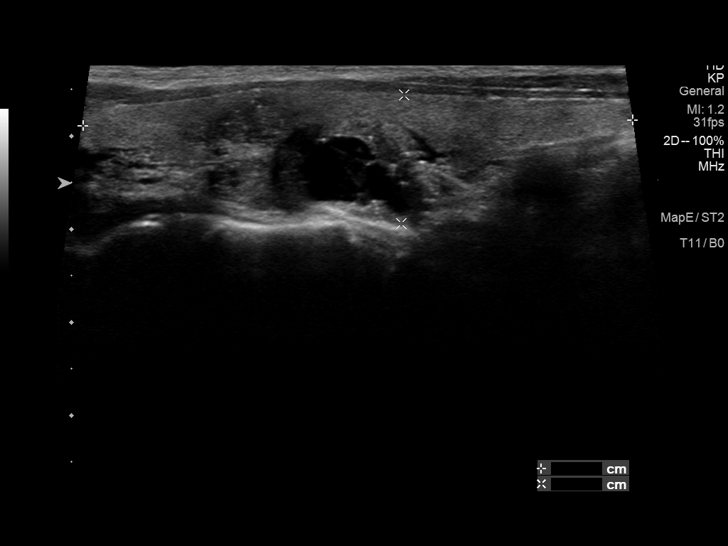
[im 46/69]
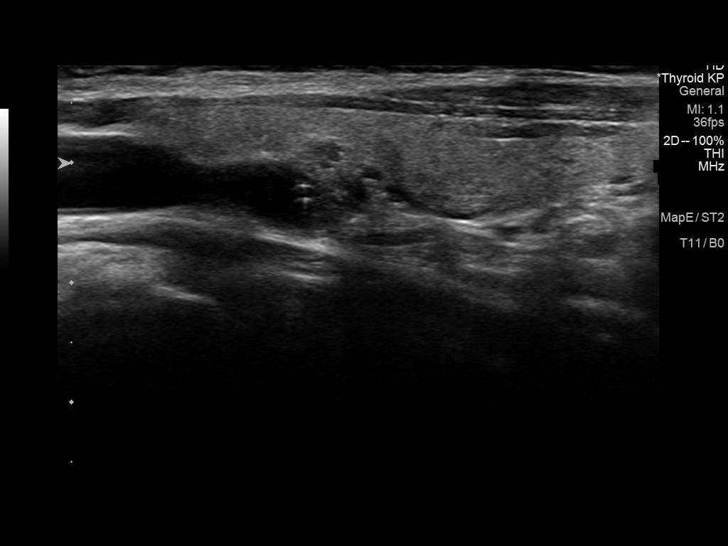
[im 52/69]
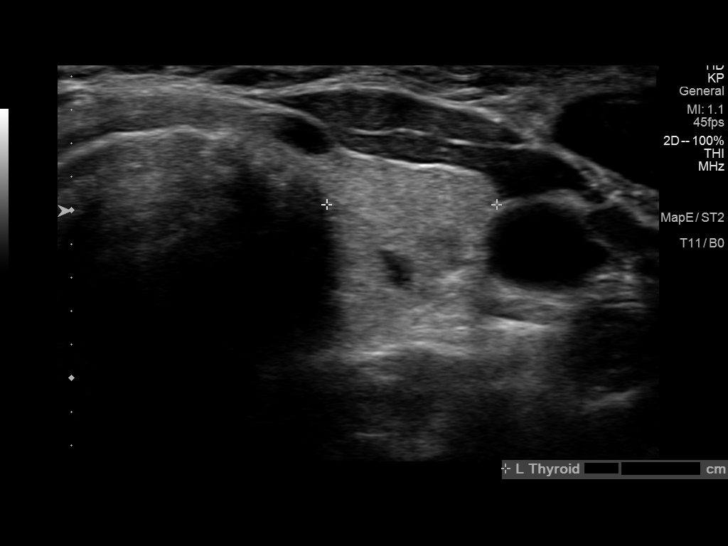
[im 57/69]
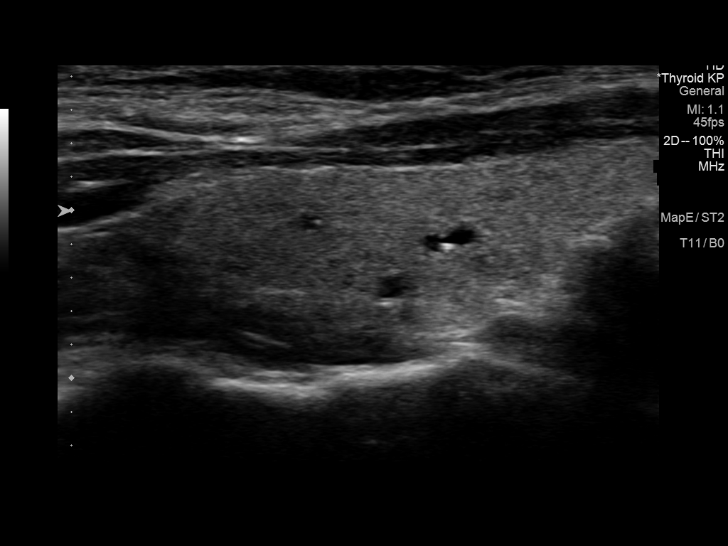
[im 63/69]
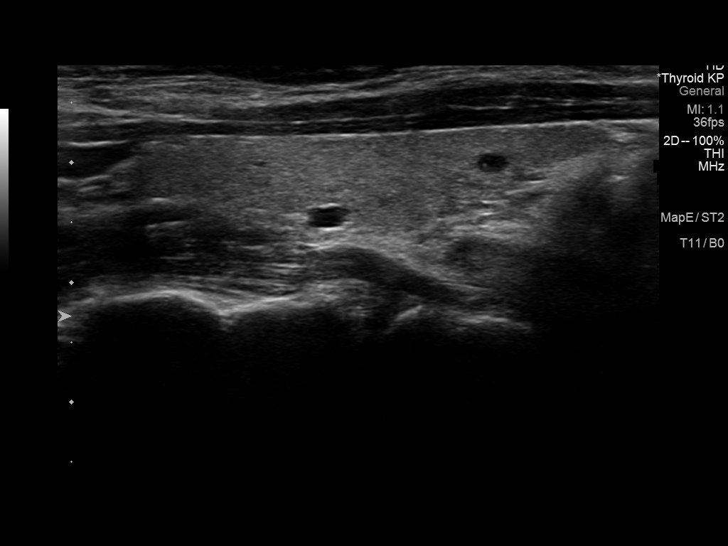
[im 69/69]
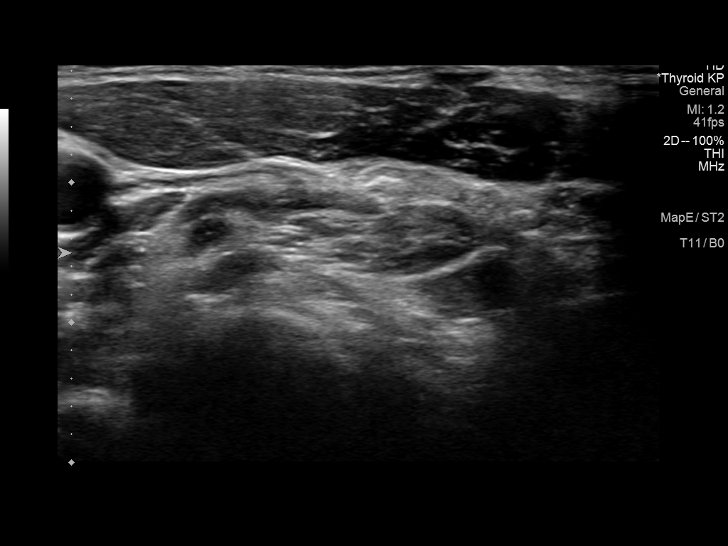

[13 of 25 positions shown; findings below may reference images not displayed]

FINDINGS: Parenchymal Echotexture: Mildly heterogenous

Isthmus: 0.2 cm

Right lobe: 5.9 x 1.4 x 1.7 cm

Left lobe: 4.3 x 0.9 x 1.0 cm

_________________________________________________________

Estimated total number of nodules >/= 1 cm: 1

Number of spongiform nodules >/=  2 cm not described below (TR1): 0

Number of mixed cystic and solid nodules >/= 1.5 cm not described
below (TR2): 0

_________________________________________________________

Nodule labeled 1 is a solid hypoechoic nodule with punctate
echogenic foci (TR 5) in the superior right thyroid lobe that
measures 0.9 x 0.8 x 0.6 cm, previously measuring 0.9 cm. It remains
similar in size and morphology.

Nodule labeled 3, previously 2) there is a mixed cystic and solid
nodule with hypoechoic solid areas that demonstrate punctate
echogenic foci (TR 4) in the mid right thyroid lobe that measures
1.8 x 1.1 x 1.0 cm, slightly intervally increased in size partially
due to increased cystic area. **Given size (>/= 1.5 cm) and
appearance, fine needle aspiration of this moderately suspicious
nodule should be considered based on TI-RADS criteria.
IMPRESSION: 1. Nodule labeled 3 (previously 2) in the mid right thyroid lobe
demonstrates slight increase in size and more conspicuous suspicious
features on today's exam (1.8 cm TR 4, previously 1.5 cm TR 3). This
nodule meets criteria for biopsy.
2. Nodule labeled 1 demonstrates stable size and appearance (0.9 cm
TR 5), and continues to meet criteria for follow-up ultrasound in 1
year. This exam marks 1 year stability.

The above is in keeping with the ACR TI-RADS recommendations - [HOSPITAL] 3737;[DATE].

## 2023-10-08 IMAGING — US US FNA BIOPSY THYROID 1ST LESION
1 series · 13 of 17 positions shown · non-contrast
Comparison: 07/24/2021, 06/21/2020

MEDICATIONS:
None

COMPLICATIONS:
None immediate.

INDICATION: Indeterminate thyroid nodule

EXAM:
ULTRASOUND GUIDED FINE NEEDLE ASPIRATION OF INDETERMINATE THYROID
NODULE
TECHNIQUE: Informed written consent was obtained from the patient after a
discussion of the risks, benefits and alternatives to treatment.
Questions regarding the procedure were encouraged and answered. A
timeout was performed prior to the initiation of the procedure.

[Series 1: us fna biopsy thyroid 1st lesion · 0.04mm/px · 17 acquisitions, 13 frames shown]
[im 1/17]
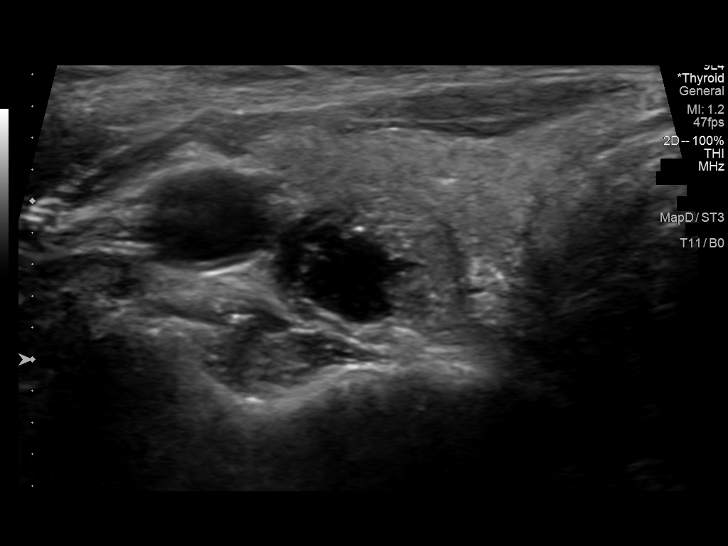
[im 2/17]
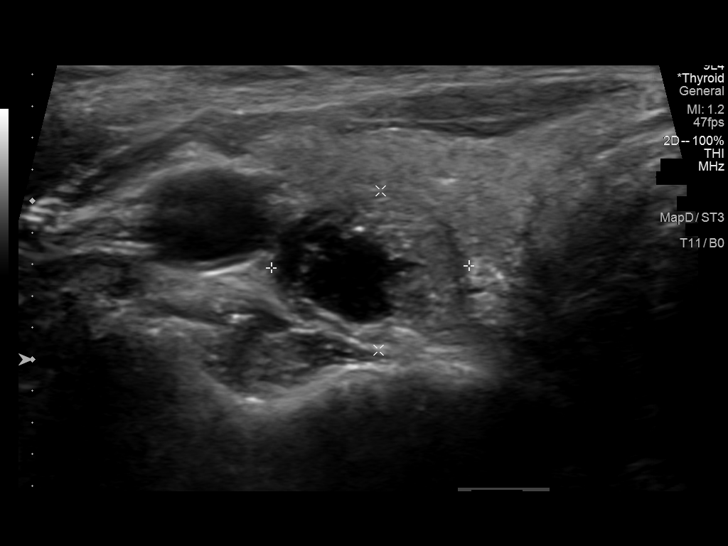
[im 4/17]
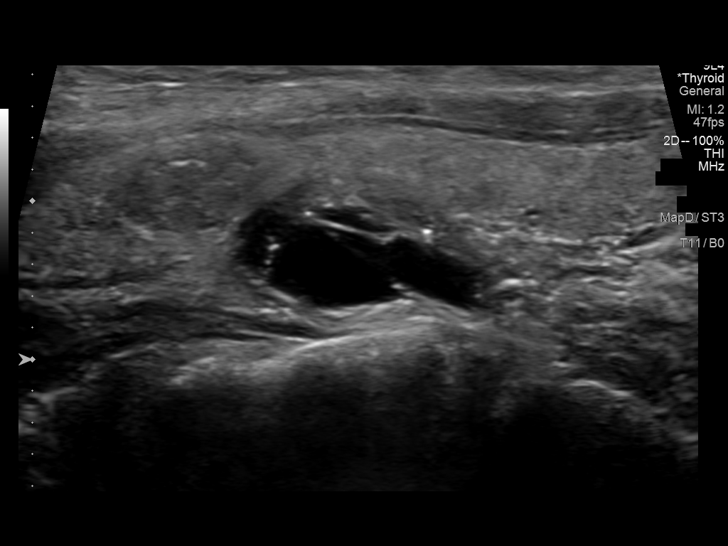
[im 5/17]
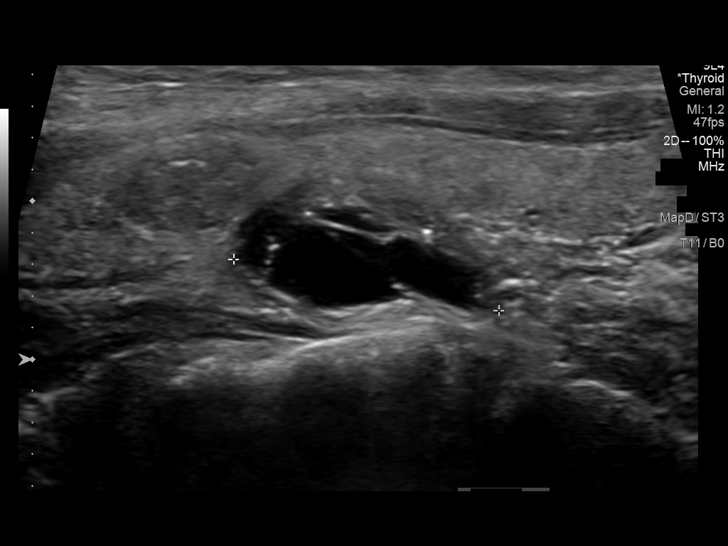
[im 6/17]
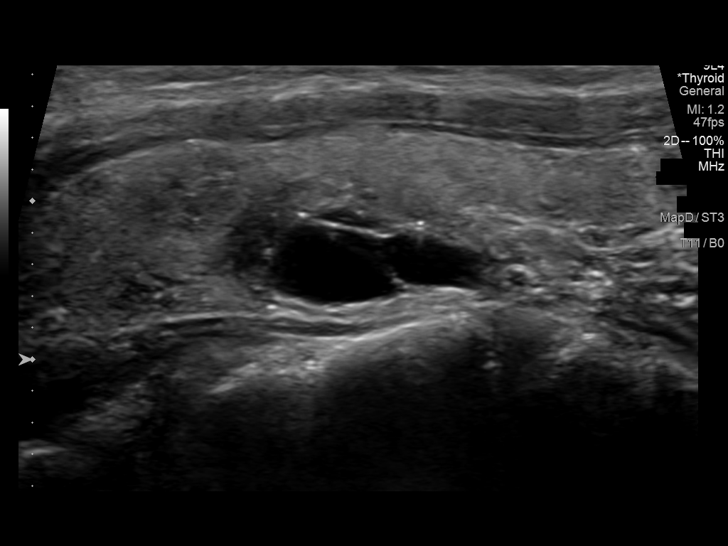
[im 8/17]
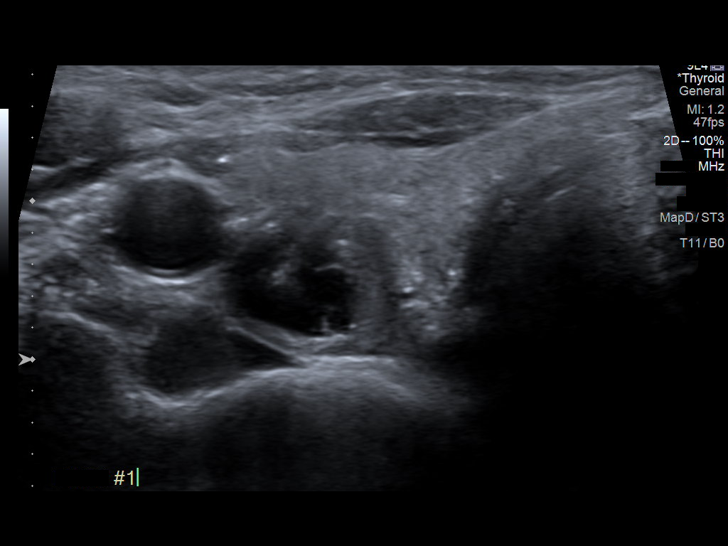
[im 9/17]
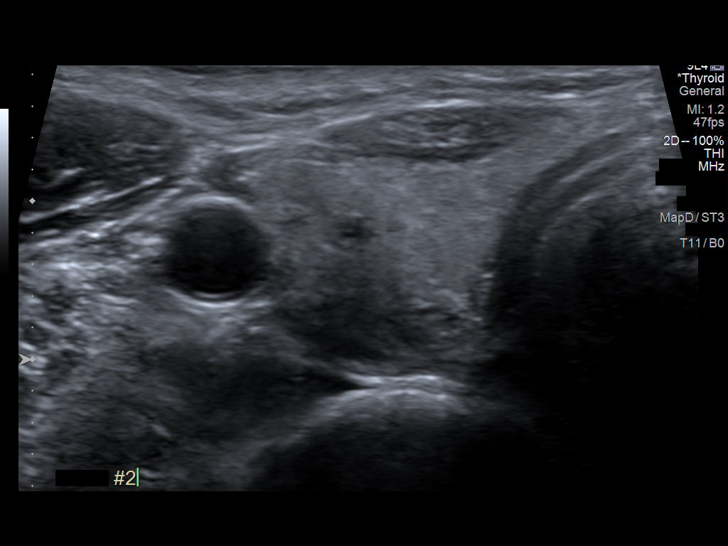
[im 10/17]
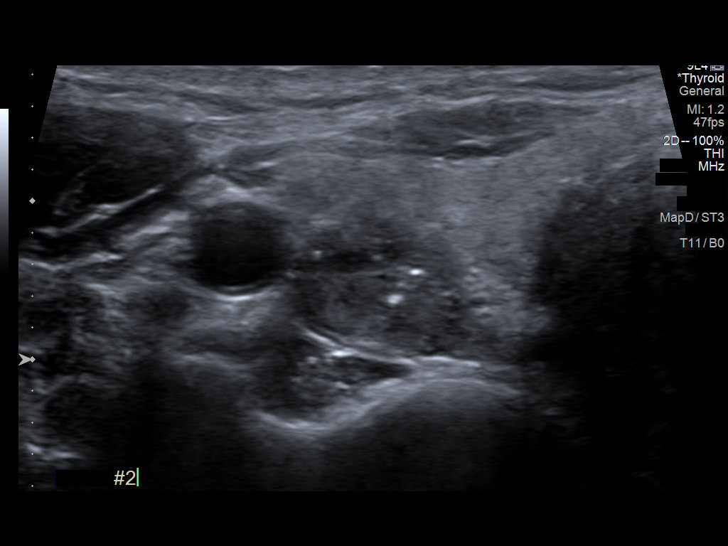
[im 12/17]
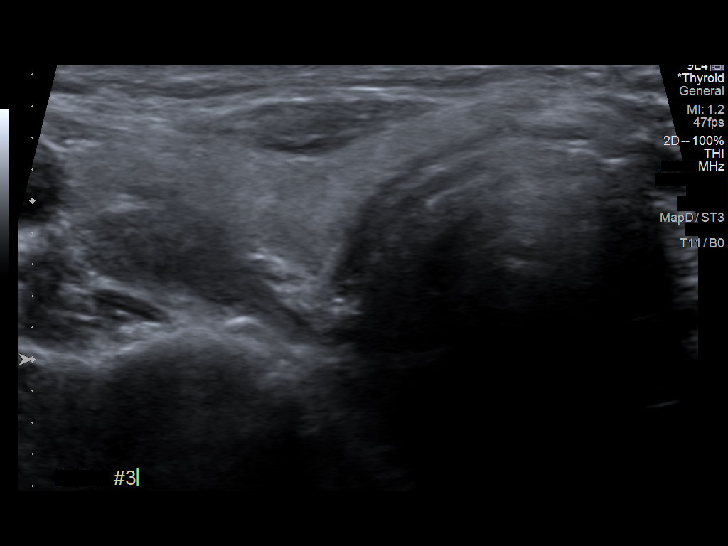
[im 13/17]
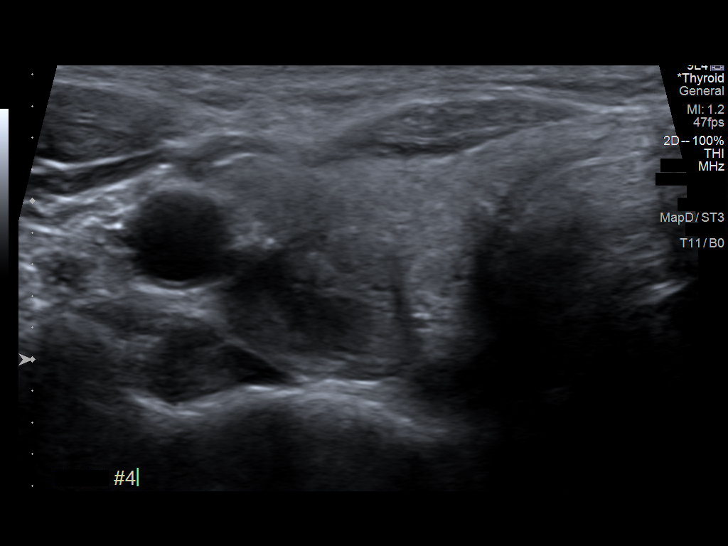
[im 14/17]
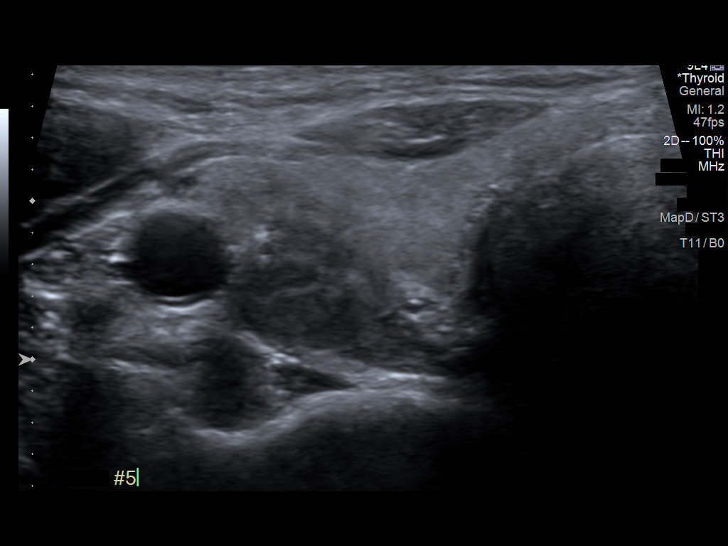
[im 16/17]
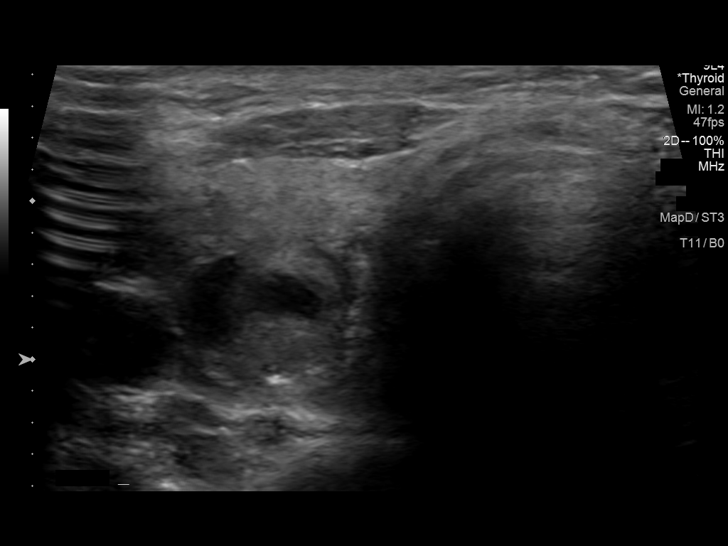
[im 17/17]
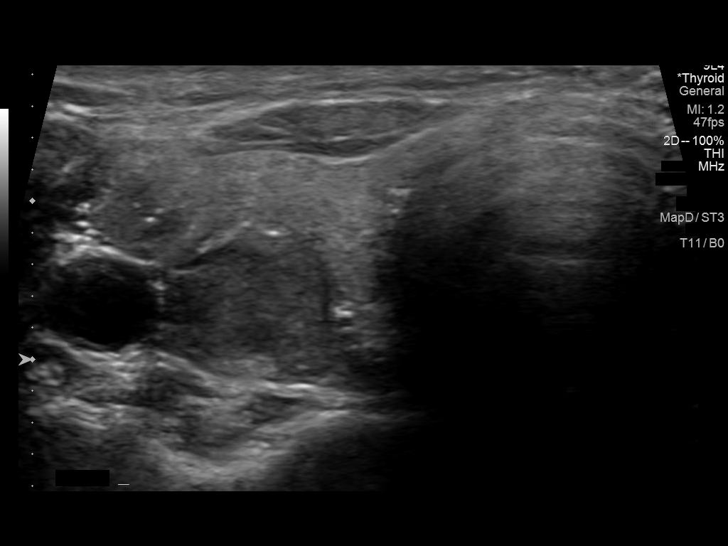

[13 of 17 positions shown; findings below may reference images not displayed]

Pre-procedural ultrasound scanning demonstrated unchanged size and
appearance of the indeterminate nodule within the right middle lobe

The procedure was planned. The neck was prepped in the usual sterile
fashion, and a sterile drape was applied covering the operative
field. A timeout was performed prior to the initiation of the
procedure. Local anesthesia was provided with 1% lidocaine.

Under direct ultrasound guidance, 5 FNA biopsies were performed of
the nodule with a 27 gauge needle. Multiple ultrasound images were
saved for procedural documentation purposes. The samples were
prepared and submitted to pathology. Two of these samples were
reserved for Afirma testing

Limited post procedural scanning was negative for hematoma or
additional complication. Dressings were placed. The patient
tolerated the above procedures procedure well without immediate
postprocedural complication.
FINDINGS: Nodule reference number based on prior diagnostic ultrasound: 3

Maximum size: 1.8cm

Location: Right; Mid

ACR TI-RADS risk category: TR4 (4-6 points)

Reason for biopsy: meets ACR TI-RADS criteria

Ultrasound imaging confirms appropriate placement of the needles
within the thyroid nodule.
IMPRESSION: Technically successful ultrasound guided fine needle aspiration of
right middle lobe thyroid nodule as described above

Performed and read by Kosmo, Ole-Fredrik

## 2024-06-15 ENCOUNTER — Emergency Department (HOSPITAL_BASED_OUTPATIENT_CLINIC_OR_DEPARTMENT_OTHER)

## 2024-06-15 ENCOUNTER — Emergency Department (HOSPITAL_BASED_OUTPATIENT_CLINIC_OR_DEPARTMENT_OTHER)
Admission: EM | Admit: 2024-06-15 | Discharge: 2024-06-16 | Disposition: A | Discharge status: Home / Self Care | Source: Home / Self Care | Attending: Emergency Medicine | Admitting: Emergency Medicine

## 2024-06-15 ENCOUNTER — Other Ambulatory Visit: Payer: Self-pay

## 2024-06-15 DIAGNOSIS — R1032 Left lower quadrant pain: Secondary | ICD-10-CM

## 2024-06-15 LAB — URINALYSIS, ROUTINE W REFLEX MICROSCOPIC
Bilirubin Urine: NEGATIVE
Glucose, UA: NEGATIVE mg/dL
Hgb urine dipstick: NEGATIVE
Ketones, ur: NEGATIVE mg/dL
Leukocytes,Ua: NEGATIVE
Nitrite: NEGATIVE
Protein, ur: NEGATIVE mg/dL
Specific Gravity, Urine: 1.016 (ref 1.005–1.030)
pH: 6.5 (ref 5.0–8.0)

## 2024-06-15 LAB — LIPASE, BLOOD: Lipase: 75 U/L — ABNORMAL HIGH (ref 11–51)

## 2024-06-15 LAB — CBC
HCT: 38.7 % (ref 36.0–46.0)
Hemoglobin: 13.1 g/dL (ref 12.0–15.0)
MCH: 31 pg (ref 26.0–34.0)
MCHC: 33.9 g/dL (ref 30.0–36.0)
MCV: 91.7 fL (ref 80.0–100.0)
Platelets: 265 K/uL (ref 150–400)
RBC: 4.22 MIL/uL (ref 3.87–5.11)
RDW: 12.7 % (ref 11.5–15.5)
WBC: 8 K/uL (ref 4.0–10.5)
nRBC: 0 % (ref 0.0–0.2)

## 2024-06-15 LAB — COMPREHENSIVE METABOLIC PANEL WITH GFR
ALT: 31 U/L (ref 0–44)
AST: 26 U/L (ref 15–41)
Albumin: 4.6 g/dL (ref 3.5–5.0)
Alkaline Phosphatase: 51 U/L (ref 38–126)
Anion gap: 10 (ref 5–15)
BUN: 18 mg/dL (ref 6–20)
CO2: 28 mmol/L (ref 22–32)
Calcium: 9.9 mg/dL (ref 8.9–10.3)
Chloride: 101 mmol/L (ref 98–111)
Creatinine, Ser: 0.93 mg/dL (ref 0.44–1.00)
GFR, Estimated: >60 mL/min (ref >60)
Glucose, Bld: 91 mg/dL (ref 70–99)
Potassium: 4.2 mmol/L (ref 3.5–5.1)
Sodium: 138 mmol/L (ref 135–145)
Total Bilirubin: 0.3 mg/dL (ref 0.0–1.2)
Total Protein: 7.2 g/dL (ref 6.5–8.1)

## 2024-06-15 MED ORDER — IOHEXOL 300 MG/ML  SOLN
100.0000 mL | Freq: Once | INTRAMUSCULAR | Status: AC | PRN
  Administered 2024-06-15: 100 mL via INTRAVENOUS

## 2024-06-15 NOTE — ED Notes (Signed)
 Patient transported to CT

## 2024-06-15 NOTE — ED Triage Notes (Signed)
 Pt states that she has had left sided abdominal bloating and pain x 5 days.

## 2024-06-16 NOTE — ED Provider Notes (Signed)
 [place] [facility]GENC[facility] DEPARTMENT AT [facility] Provider Note   CSN: [id number] Arrival date & time: 06/15/24  2053     Patient presents with: Abdominal Pain   Madeline Walker is a 53 y.o. female.   The history is provided by the patient and the spouse.  Patient presents with abdominal pain. Patient reports she has had gradual worsening left lower quadrant abdominal pain for the past 5 days with bloating and swelling.  She denies any nausea or vomiting.  She has been having her normal bowel movements without any blood or melena.  No dysuria.  No chest pain or shortness of breath. 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 Authorizing Provider  estradiol  (VIVELLE -DOT) 0.05 MG/24HR patch Place 1 patch onto the skin every 3 (three) days. 11/09/17   [provider]  progesterone  (PROMETRIUM ) 100 MG capsule Take 200 mg by mouth at bedtime. 05/18/17   [provider]  Testosterone  25 MG/2.5GM (1%) GEL testosterone  transdermal cream; 1/4-1/2 GRAMS daily behind knees 08/16/20   [provider]    Allergies: Patient has no known allergies.    Review of Systems  Constitutional:  Negative for fever.  Cardiovascular:  Negative for chest pain.  Gastrointestinal:  Positive for abdominal pain. Negative for blood in stool, diarrhea and vomiting.  Genitourinary:  Negative for dysuria.    Updated Vital Signs BP 136/68 (BP Location: Right Arm)   Pulse 79   Temp 98.1 F (36.7 C)   Resp 16   LMP 01/29/2011   SpO2 100%   Physical Exam CONSTITUTIONAL: Well developed/well nourished HEAD: Normocephalic/atraumatic ENMT: Mucous membranes moist NECK: supple no meningeal signs CV: S1/S2 noted, no murmurs/rubs/gallops noted LUNGS: Lungs are clear to auscultation bilaterally, no apparent distress ABDOMEN:  soft, healed incision noted to the lower abdomen, no rebound or guarding, bowel sounds noted throughout abdomen, moderate tenderness left lower quadrant Tenderness noted over the umbilicus but no hernias noted No discoloration or overlying erythema GU:no cva tenderness NEURO: Pt is awake/alert/appropriate, moves all extremitiesx4.  No facial droop.   EXTREMITIES: pulses normal/equal, full ROM SKIN: warm, color normal PSYCH: no abnormalities of mood noted, alert and oriented to situation  (all labs ordered are listed, but only abnormal results are displayed) Labs Reviewed  LIPASE, BLOOD - Abnormal; Notable for the following components:      Result Value   Lipase 75 (*)    All other components within normal limits  COMPREHENSIVE METABOLIC PANEL WITH GFR  CBC  URINALYSIS, ROUTINE W REFLEX MICROSCOPIC    EKG: None  Radiology: CT ABDOMEN PELVIS W CONTRAST Result Date: 06/15/2024 EXAM: CT ABDOMEN AND PELVIS WITH CONTRAST 06/15/2024 11:43:38 PM TECHNIQUE: CT of the abdomen and pelvis was performed with the administration of 100 mL of iohexol  (OMNIPAQUE ) 300 MG/ML solution. Multiplanar reformatted images are provided for review. Automated exposure control, iterative reconstruction, and/or weight-based adjustment of the mA/kV was utilized to reduce the radiation dose to as low as reasonably achievable. COMPARISON: 01/13/2018 CLINICAL HISTORY: LLQ abdominal pain; h/o volvulus FINDINGS: LOWER CHEST: Lung bases are free of acute infiltrate or sizable effusion. LIVER: The liver is unremarkable. GALLBLADDER AND BILE DUCTS: Gallbladder is unremarkable. No biliary ductal dilatation. SPLEEN: No acute abnormality. PANCREAS: No acute abnormality. ADRENAL GLANDS: No acute abnormality. KIDNEYS, URETERS AND BLADDER: No stones in the kidneys or ureters. No hydronephrosis.  No perinephric or periureteral stranding. The bladder is decompressed. GI AND BOWEL: Changes consistent with prior right hemicolectomy are  noted. The small bowel to colonic anastomosis is widely patent. No small bowel dilatation is seen. The stomach is within normal limits. PERITONEUM AND RETROPERITONEUM: No ascites. No free air. VASCULATURE: Aorta is normal in caliber. LYMPH NODES: No lymphadenopathy. REPRODUCTIVE ORGANS: No acute abnormality. BONES AND SOFT TISSUES: No acute osseous abnormality. No focal soft tissue abnormality. IMPRESSION: 1. No acute findings. 2. Postsurgical changes from prior right hemicolectomy with widely patent small bowel-to-colonic anastomosis and no small bowel dilatation. Electronically signed by: Oneil Devonshire MD 06/15/2024 11:47 PM EST RP Workstation: [id number]     Procedures   Medications Ordered in the ED  iohexol  (OMNIPAQUE ) 300 MG/ML solution 100 mL (100 mLs Intravenous Contrast Given 06/15/24 2336)                                    Medical Decision Making Amount and/or Complexity of Data Reviewed Labs: ordered. Radiology: ordered.  Risk Prescription drug management.   This patient presents to the ED for concern of abdominal pain, this involves an extensive number of treatment options, and is a complaint that carries with it a high risk of complications and morbidity.  The differential diagnosis includes but is not limited to cholecystitis, cholelithiasis, pancreatitis, gastritis, peptic ulcer disease, bowel obstruction, bowel perforation, diverticulitis, ischemic bowel, UTI  Comorbidities that complicate the patient evaluation: Patients presentation is complicated by their history of previous volvulus  Additional history obtained: Additional history obtained from spouse Records reviewed previous admission documents  Lab Tests: I Ordered, and personally interpreted labs.  The pertinent results include: Labs overall unremarkable  Imaging Studies ordered: I ordered imaging studies including CT scan abdomen pelvis  I independently visualized and interpreted imaging which showed no  acute findings I agree with the radiologist interpretation  Medicines ordered and prescription drug management: Patient declines pain meds  Reevaluation: After the interventions noted above, I reevaluated the patient and found that they have :stayed the same  Complexity of problems addressed: Patients presentation is most consistent with  acute presentation with potential threat to life or bodily function  Disposition: After consideration of the diagnostic results and the patients response to treatment,  I feel that the patent would benefit from discharge  .   No acute findings noted on CT imaging.  Patient is in no acute distress, feels comfortable for discharge home. She will follow-up with her GI specialist, we discussed strict return precautions     Final diagnoses:  Left lower quadrant abdominal pain    ED Discharge Orders     None          Midge Golas, MD 06/16/24 [place]

## 2024-06-16 NOTE — Discharge Instructions (Signed)
   SEEK IMMEDIATE MEDICAL ATTENTION IF: The pain does not go away or becomes severe, particularly over the next 8-12 hours.  A temperature above 100.2F develops.  Repeated vomiting occurs (multiple episodes).   Blood is being passed in stools or vomit (bright red or black tarry stools).  Return also if you develop chest pain, difficulty breathing, dizziness or fainting, or become confused, poorly responsive, or inconsolable.

## 2024-09-07 ENCOUNTER — Ambulatory Visit: Admitting: Internal Medicine
